# Patient Record
Sex: Female | Born: 1980 | Race: Black or African American | Hispanic: No | Marital: Single | Smoking: Never smoker
Health system: Southern US, Community
[De-identification: ages and names within clinical notes are randomized; demographics above are authoritative.]

## PROBLEM LIST (undated history)

## (undated) DIAGNOSIS — Z8742 Personal history of other diseases of the female genital tract: Secondary | ICD-10-CM

## (undated) DIAGNOSIS — D219 Benign neoplasm of connective and other soft tissue, unspecified: Secondary | ICD-10-CM

## (undated) DIAGNOSIS — F419 Anxiety disorder, unspecified: Secondary | ICD-10-CM

## (undated) HISTORY — DX: Benign neoplasm of connective and other soft tissue, unspecified: D21.9

## (undated) HISTORY — DX: Anxiety disorder, unspecified: F41.9

## (undated) HISTORY — DX: Personal history of other diseases of the female genital tract: Z87.42

---

## 2006-03-13 ENCOUNTER — Ambulatory Visit: Payer: Self-pay | Admitting: Internal Medicine

## 2006-03-17 ENCOUNTER — Ambulatory Visit: Payer: Self-pay | Admitting: Internal Medicine

## 2006-05-09 ENCOUNTER — Ambulatory Visit: Payer: Self-pay | Admitting: Internal Medicine

## 2007-01-23 ENCOUNTER — Inpatient Hospital Stay (HOSPITAL_COMMUNITY): Admission: EM | Admit: 2007-01-23 | Discharge: 2007-01-24 | Payer: Self-pay | Admitting: Family Medicine

## 2007-01-23 ENCOUNTER — Ambulatory Visit: Payer: Self-pay | Admitting: Family Medicine

## 2007-01-25 ENCOUNTER — Emergency Department (HOSPITAL_COMMUNITY): Admission: EM | Admit: 2007-01-25 | Discharge: 2007-01-25 | Payer: Self-pay | Admitting: Emergency Medicine

## 2007-01-26 ENCOUNTER — Emergency Department (HOSPITAL_COMMUNITY): Admission: EM | Admit: 2007-01-26 | Discharge: 2007-01-26 | Payer: Self-pay | Admitting: Emergency Medicine

## 2007-01-29 ENCOUNTER — Ambulatory Visit: Payer: Self-pay | Admitting: Family Medicine

## 2007-02-05 ENCOUNTER — Ambulatory Visit: Payer: Self-pay | Admitting: Family Medicine

## 2007-06-12 ENCOUNTER — Ambulatory Visit: Payer: Self-pay | Admitting: Gastroenterology

## 2007-07-26 ENCOUNTER — Ambulatory Visit: Payer: Self-pay | Admitting: Gastroenterology

## 2007-09-21 ENCOUNTER — Ambulatory Visit: Payer: Self-pay | Admitting: Internal Medicine

## 2007-10-04 ENCOUNTER — Ambulatory Visit: Payer: Self-pay | Admitting: Internal Medicine

## 2007-11-09 ENCOUNTER — Inpatient Hospital Stay (HOSPITAL_COMMUNITY): Admission: RE | Admit: 2007-11-09 | Discharge: 2007-11-11 | Payer: Self-pay | Admitting: *Deleted

## 2007-11-09 ENCOUNTER — Ambulatory Visit: Payer: Self-pay | Admitting: Gastroenterology

## 2007-11-09 ENCOUNTER — Emergency Department (HOSPITAL_COMMUNITY): Admission: EM | Admit: 2007-11-09 | Discharge: 2007-11-09 | Payer: Self-pay | Admitting: Emergency Medicine

## 2007-11-09 ENCOUNTER — Ambulatory Visit: Payer: Self-pay | Admitting: *Deleted

## 2008-01-03 ENCOUNTER — Ambulatory Visit: Payer: Self-pay | Admitting: Gastroenterology

## 2008-02-12 ENCOUNTER — Ambulatory Visit (HOSPITAL_COMMUNITY): Admission: RE | Admit: 2008-02-12 | Discharge: 2008-02-12 | Payer: Self-pay | Admitting: Obstetrics and Gynecology

## 2008-03-28 ENCOUNTER — Ambulatory Visit: Payer: Self-pay | Admitting: Gastroenterology

## 2008-06-17 ENCOUNTER — Ambulatory Visit: Payer: Self-pay | Admitting: Gastroenterology

## 2008-08-11 ENCOUNTER — Emergency Department (HOSPITAL_COMMUNITY): Admission: EM | Admit: 2008-08-11 | Discharge: 2008-08-11 | Payer: Self-pay | Admitting: Emergency Medicine

## 2008-09-15 ENCOUNTER — Ambulatory Visit: Payer: Self-pay | Admitting: Gastroenterology

## 2008-12-17 ENCOUNTER — Ambulatory Visit: Payer: Self-pay | Admitting: Gastroenterology

## 2009-05-31 ENCOUNTER — Ambulatory Visit (HOSPITAL_COMMUNITY): Admission: RE | Admit: 2009-05-31 | Discharge: 2009-05-31 | Payer: Self-pay | Admitting: Family Medicine

## 2010-11-01 LAB — URINALYSIS, ROUTINE W REFLEX MICROSCOPIC
Hgb urine dipstick: NEGATIVE
Protein, ur: 30 mg/dL — AB
Urobilinogen, UA: 1 mg/dL (ref 0.0–1.0)

## 2010-11-01 LAB — CBC
HCT: 42.4 % (ref 36.0–46.0)
Hemoglobin: 14 g/dL (ref 12.0–15.0)
RBC: 4.7 MIL/uL (ref 3.87–5.11)
RDW: 13.1 % (ref 11.5–15.5)
WBC: 6.2 10*3/uL (ref 4.0–10.5)

## 2010-11-01 LAB — DIFFERENTIAL
Eosinophils Relative: 0 % (ref 0–5)
Lymphocytes Relative: 16 % (ref 12–46)
Lymphs Abs: 1 10*3/uL (ref 0.7–4.0)
Monocytes Absolute: 0.5 10*3/uL (ref 0.1–1.0)
Monocytes Relative: 8 % (ref 3–12)
Neutro Abs: 4.7 10*3/uL (ref 1.7–7.7)

## 2010-11-01 LAB — BASIC METABOLIC PANEL
GFR calc Af Amer: >60 mL/min (ref >60)
GFR calc non Af Amer: >60 mL/min (ref >60)
Glucose, Bld: 114 mg/dL — ABNORMAL HIGH (ref 70–99)
Potassium: 3.5 mEq/L (ref 3.5–5.1)
Sodium: 138 mEq/L (ref 135–145)

## 2010-11-01 LAB — URINE MICROSCOPIC-ADD ON

## 2010-11-30 NOTE — Discharge Summary (Signed)
NAMECARRINGTON, MULLENAX              ACCOUNT NO.:  [id number]   MEDICAL RECORD NO.:  [id number]          PATIENT TYPE:  EMS   LOCATION:  MAJO                         FACILITY:  [facility]   PHYSICIAN:  Wayne A. Sheffield Slider, M.D.    DATE OF BIRTH:  03/27/1981   DATE OF ADMISSION:  01/23/2007  DATE OF DISCHARGE:  01/24/2007                               DISCHARGE SUMMARY   PRIMARY CARE PHYSICIAN:  See at [facility]   CONSULTATIONS:  None.   PROCEDURE:  None.   DISCHARGE DIAGNOSIS:  Migraine headache.   DISCHARGE MEDICATIONS:  Percocet 5 mg one tab every 4 hours as needed  for headache, #10 no refills.   BRIEF HOSPITAL COURSE:  Ms. Husby is a 30 year old female who presented  to the [facility] with headache, photophobia, neck  stiffness, and soreness, thrombocytopenia, and heme positive stools.  She was sent to the [facility] for possible  ehrlichiosis or viral meningitis.  Upon arrival she was afebrile and had  no meningeal signs.  She had a normal white count and platelets of 104.  She does have a history of migraines as a teenager.  Her at [facility], her only complaint was a headache.  She was admitted to  observe and LP was not done initially as patient was afebrile, had no  meningeal signs, or any objective or clinical signs of infectious  etiology.  She remained afebrile overnight.  A CBC and CMP were normal  in the morning.  Her only complaint remained a headache in the morning  by temporal throbbing with mild light sensitivity.  She was given 2  Percocet 5 mg tabs and approximately 4 hours later, the headache did  resolve and the patient felt much better.  As the patient had no  objective or clinical signs pointing to infectious etiology.  She was  sent home with the Percocet prescription as described above and given  instructions that should she develop a fever, weakness, confusion,  nausea, or vomiting she should be seen  immediately back at the [facility].  The patient was discharged home with her parents in  stable medical condition.      Ardeen Garland, MD  Electronically Signed      Arnette Norris. Sheffield Slider, M.D.  Electronically Signed    LM/MEDQ  D:  01/25/2007  T:  01/25/2007  Job:  [id number]

## 2010-11-30 NOTE — H&P (Signed)
[name], BHARDWAJ NO.:  [id number]   MEDICAL RECORD NO.:  [id number]          PATIENT TYPE:  IPS   LOCATION:  0304                          FACILITY[facility]  [facility]   PHYSICIAN:  Jasmine Pang, M.D. DATE OF BIRTH:  21-May-1981   DATE OF ADMISSION:  11/09/2007  DATE OF DISCHARGE:                       PSYCHIATRIC ADMISSION ASSESSMENT   This is a voluntary admission to the services of Dr. Milford Cage.   IDENTIFYING INFORMATION:  This is a 30 year old single [place] American  female.  Yesterday she drank a fifth of vodka and took 20 Extra Strength  Tylenol after having an argument with her boyfriend.  She then called a  friend of hers who happens to be a Veterinary surgeon.  She reported that she was  feeling depressed.  She stated she does not want to be here anymore,  that nothing is going right.  She had not been eating well the past 2  days and has had difficulty sleeping the last couple of days.  She  denied that her overdose was an attempt to harm herself but did want to  be assessed.  She states today that she is employed at [employer] in a  temporary position.  This was supposed to become a full-time job but due  to the economic situation, it will not.  She is hoping to relocate to  [place] but is very frustrated with the current economic situation.  She  does have an MBA.   PAST PSYCHIATRIC HISTORY:  In high school she felt sad.  She did not fit  in with anyone, she just wanted to quit school.  Her mom took her to  therapy.  She thinks she was on Prozac for a while but it did not seem  to help and she did not take it any length of time.  She denies any  other psychiatric history.   SOCIAL HISTORY:  She received her MBA in December 2007.  She does  currently have a boyfriend but she states they could use some time  apart.   FAMILY HISTORY:  She has an aunt who is treated for anxiety.   ALCOHOL AND DRUG HISTORY:  She does not have any.   PRIMARY CARE PHYSICIAN:  Dr.  Merla Riches.  She does not have any current  psychiatric care.   MEDICAL PROBLEMS:  She does not have any.   MEDICATIONS:  She takes birth control pills.   ALLERGIES:  She has no known drug allergies.   POSITIVE PHYSICAL FINDINGS:  She was medically cleared in the emergency  department.  She did not even need to have charcoal as she had already  vomited up the pills.  Her labs did not have any abnormal findings.  Vital signs on admission show that she is 61-and-a-quarter-inches tall.  She weighs 165.  Temperature is 99.5, blood pressure is 133/82 to  121/76, pulse was 72 and respirations are 18.   MENTAL STATUS EXAM:  Today she is alert and oriented x3.  She was  appropriately groomed and dressed in hospital scrubs.  She appeared to  be adequately nourished.  Her speech  was not pressured.  Her mood is  appropriate to the situation.  Her affect had a normal range.  Her  thought processes are clear, rational and goal oriented.  Judgment and  insight are good, intact.  Concentration and memory are intact.  Intelligence is at least average.  She denies being suicidal or  homicidal.  She denies auditory or visual hallucinations.  She denies  the need for an antidepressant.  She states she just made a bad decision  yesterday.   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  AXIS II:  Deferred.  AXIS III:  Mononucleosis x1 in the past.  AXIS IV:  Severe issues with primary support group, occupational issues.  AXIS V:  30.   The plan is to admit for safety and stabilization.  Her mom has come in  and Dr. Katrinka Blazing indicated that she would like the counselors to have a  family session with her mom and they will make a decision whether to  start an antidepressant or not.  Estimated length of stay is going to be  2-3 days.      Mickie Leonarda Salon, P.A.-C.      Jasmine Pang, M.D.  Electronically Signed     MD/MEDQ  D:  11/10/2007  T:  11/10/2007  Job:  [id number]
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
## 2010-12-03 NOTE — Assessment & Plan Note (Signed)
[facility]                           GASTROENTEROLOGY OFFICE NOTE   NAME:Neisen, SYBOL MORRE                     MRN:          [id number]  DATE:03/13/2006                            DOB:          [birth year]    REFERRAL SOURCE:  The patient is self-referred.   REASON FOR CONSULTATION:  Worsening constipation, abdominal pain and rectal  bleeding.   HISTORY:  This is a pleasant 30 year old [place] American female with no  significant past medical history, who refers herself for evaluation  regarding constipation, abdominal pain and rectal bleeding.  She reports  problems with severe constipation dating approximately 7 years.  She  initially thought the problem was due to her lifestyle and after college,  began on a regimen of exercise and healthy diet.  With this, she did notice  some improvement in her bowel habits.  However, in recent years, despite  these measures, the constipation has worsened.  Associated with constipation  is significant abdominal pain and occasional vomiting.  In order to have  regular bowel movements, she takes a senna-based herbal laxative 3 times per  week.  Post defecation, her abdominal symptoms improve.  Over the past 2  weeks, she has noticed a new symptom of rectal bleeding manifested  principally by drops of red blood in the toilet as well as on the tissue.  No associated rectal pain.  She has not had prior evaluation.  She describes  her appetite as good and despite exercise, her weight has increased.  This  disturbs her.   PAST MEDICAL HISTORY:  None.   PAST SURGICAL HISTORY:  None.   ALLERGIES:  No known drug allergies.   REGULAR MEDICATIONS:  A senna-based herbal laxative three times per week.   FAMILY HISTORY:  No family history of colon cancer or colon polyps.  The  patient thinks that both of her parents have had colonoscopy with no  neoplasia found.   SOCIAL HISTORY:  The patient is single without  children.  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  She does not smoke or use alcohol.   REVIEW OF SYSTEMS:  Per diagnostic evaluation performed.   PHYSICAL EXAMINATION:  GENERAL:  A pleasant, well-appearing female in no  acute distress.  VITAL SIGNS:  Blood pressure is 120/80.  Heart rate is 60 and regular.  Weight is 175.2 pounds.  She is 5 feet 2 inches in height.  HEENT:  Sclerae are anicteric.  Conjunctivae are pink.  Oral mucosa intact.  NECK:  No adenopathy.  Thyroid appears normal.  LUNGS:  Clear.  HEART:  Regular.  ABDOMEN:  Obese and soft without tenderness, mass or hernia.  Good bowel  sounds are heard.  RECTAL:  Exam was deferred.  EXTREMITIES:  Without edema.   IMPRESSION:  This is a 30 year old female with chronic worsening  constipation associated with abdominal pain, occasional vomiting and now new-  onset rectal bleeding.  She most likely has functional constipation with  bleeding due to minor benign anorectal pathology.  Organic  causes for  constipation and obstructive pathology should be excluded.   RECOMMENDATIONS:  1. Laboratories today including thyroid-stimulating hormone, CBC, and      comprehensive metabolic panel to include calcium.  2. Schedule colonoscopy.  The nature of the procedure as well as the      risks, benefits and alternatives were reviewed.  She understood and      agreed to proceed.  3. If laboratories and colonoscopy unrevealing for significant pathology,      then provide daily bowel regimen in the form of MiraLax.                                   Wilhemina Bonito. Eda Keys., MD   JNP/MedQ  DD:  03/13/2006  DT:  03/14/2006  Job #:  [id number]
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
## 2010-12-03 NOTE — Assessment & Plan Note (Signed)
 [facility]                           GASTROENTEROLOGY OFFICE NOTE   NAME:Meunier, [name]                     MRN:          [id number]  DATE:05/09/2006                            DOB:          1980-08-01    HISTORY:  [name] presents today for followup.  She is a 30 year old  evaluated March 13, 2006 for worsening constipation with associated  abdominal pain and rectal bleeding.  On March 17, 2006 she underwent  complete colonoscopy including intubation of the terminal ileum.  This was  normal.  She was placed on MiraLax for her bowels.  On MiraLax she has  regular bowel movements without pain.  No further bleeding.  She does  describe 1 episode last weeks where she developed some midabdominal  discomfort, diaphoresis and presyncopal symptoms with vomiting.  Initially  clear, subsequently dark mucoid material.  She also had some transient dark  stools which have resolved.  After her episode of pain no further recurrence  or other symptoms.  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   PHYSICAL EXAMINATION:  Finds a well-appearing female in no acute distress.  Blood pressure 110/68.  Heart rate 60.  Weight is 171.4 pounds.  HEENT:  Sclerae anicteric.  Conjunctivae are pink.  LUNGS:  Are clear.  HEART:  Regular.  ABDOMEN:  Soft without tenderness, mass or hernia.   IMPRESSION:  1. Functional constipation.  Negative colonoscopy.  Improved on Miralax.  2. Minor rectal bleeding likely due to benign intrarectal pathology.  3. Recent episode of transient abdominal discomfort with transient change      in stool color.  Improved.  Cause uncertain.   RECOMMENDATIONS:  1. Continue Miralax as needed for constipation.  2. Follow up as needed.  If she were to develop recurrent problems with      abdominal pain, vomiting or dark stools, she should return for      additional evaluation.  She understands.      ______________________________  Wilhemina Bonito. Eda Keys., MD      JNP/MedQ  DD:  05/09/2006  DT:  05/10/2006  Job #:  [id number]

## 2011-04-12 LAB — COMPREHENSIVE METABOLIC PANEL
ALT: 16
AST: 23
CO2: 24
Calcium: 9.5
Chloride: 108
Creatinine, Ser: 0.9
GFR calc non Af Amer: >60
Glucose, Bld: 88
Total Bilirubin: 1.8 — ABNORMAL HIGH

## 2011-04-12 LAB — DIFFERENTIAL
Basophils Absolute: 0
Eosinophils Absolute: 0
Eosinophils Relative: 1
Lymphocytes Relative: 35
Lymphs Abs: 1.6
Neutrophils Relative %: 53

## 2011-04-12 LAB — POCT PREGNANCY, URINE: Preg Test, Ur: NEGATIVE

## 2011-04-12 LAB — CBC
Hemoglobin: 13.6
MCHC: 34
MCV: 88.2
RBC: 4.53
WBC: 4.6

## 2011-04-12 LAB — RAPID URINE DRUG SCREEN, HOSP PERFORMED
Barbiturates: NOT DETECTED
Benzodiazepines: NOT DETECTED

## 2011-04-12 LAB — URINALYSIS, ROUTINE W REFLEX MICROSCOPIC
Leukocytes, UA: NEGATIVE
Nitrite: NEGATIVE
Protein, ur: 30 — AB
Specific Gravity, Urine: 1.043 — ABNORMAL HIGH
Urobilinogen, UA: 1

## 2011-04-12 LAB — URINE MICROSCOPIC-ADD ON

## 2011-04-12 LAB — TRICYCLICS SCREEN, URINE: TCA Scrn: NOT DETECTED

## 2011-04-12 LAB — SALICYLATE LEVEL: Salicylate Lvl: <4

## 2011-05-03 LAB — DIFFERENTIAL
Basophils Absolute: 0
Basophils Relative: 0
Basophils Relative: 0
Eosinophils Absolute: 0
Eosinophils Relative: 0
Lymphs Abs: 1.6
Metamyelocytes Relative: 0
Monocytes Absolute: 0.7
Monocytes Relative: 8
Myelocytes: 0
Neutro Abs: 2.9
Neutrophils Relative %: 63
Neutrophils Relative %: 71

## 2011-05-03 LAB — I-STAT 8, (EC8 V) (CONVERTED LAB)
Acid-base deficit: 1
BUN: 3 — ABNORMAL LOW
Bicarbonate: 23.6
HCT: 39
Operator id: 192351
TCO2: 25
pCO2, Ven: 37.9 — ABNORMAL LOW

## 2011-05-03 LAB — CBC
HCT: 36.4
HCT: 39.5
HCT: 42.2
Hemoglobin: 13.2
Hemoglobin: 13.9
MCHC: 33.1
MCHC: 33.5
MCHC: 33.5
MCV: 85
MCV: 86.1
Platelets: 200
Platelets: 201
Platelets: 216
Platelets: 227
RBC: 4.28
RDW: 13.7
RDW: 13.8
RDW: 13.8
WBC: 5.6

## 2011-05-03 LAB — BASIC METABOLIC PANEL
BUN: 6
BUN: 6
CO2: 26
Chloride: 103
Glucose, Bld: 99
Potassium: 3.7
Potassium: 3.7
Sodium: 139

## 2011-05-03 LAB — POCT I-STAT CREATININE
Creatinine, Ser: 0.9
Operator id: 192351

## 2011-05-03 LAB — COMPREHENSIVE METABOLIC PANEL
Alkaline Phosphatase: 59
BUN: 6
Calcium: 9.6
Creatinine, Ser: 0.81
Glucose, Bld: 93
Potassium: 4
Total Protein: 7.7

## 2011-05-03 LAB — URINALYSIS, ROUTINE W REFLEX MICROSCOPIC
Glucose, UA: NEGATIVE
Ketones, ur: 40 — AB
pH: 6

## 2011-06-08 ENCOUNTER — Ambulatory Visit
Admission: RE | Admit: 2011-06-08 | Discharge: 2011-06-08 | Disposition: A | Discharge status: Home / Self Care | Payer: BC Managed Care – PPO | Source: Ambulatory Visit | Attending: Family Medicine | Admitting: Family Medicine

## 2011-06-08 ENCOUNTER — Other Ambulatory Visit: Payer: Self-pay | Admitting: Family Medicine

## 2011-06-08 DIAGNOSIS — R05 Cough: Secondary | ICD-10-CM

## 2011-09-05 ENCOUNTER — Ambulatory Visit: Payer: Self-pay

## 2011-09-12 ENCOUNTER — Ambulatory Visit: Payer: Self-pay

## 2013-01-05 ENCOUNTER — Emergency Department (HOSPITAL_COMMUNITY): Payer: BC Managed Care – PPO

## 2013-01-05 ENCOUNTER — Emergency Department (HOSPITAL_COMMUNITY)
Admission: EM | Admit: 2013-01-05 | Discharge: 2013-01-05 | Disposition: A | Discharge status: Home / Self Care | Payer: BC Managed Care – PPO | Attending: Emergency Medicine | Admitting: Emergency Medicine

## 2013-01-05 ENCOUNTER — Encounter (HOSPITAL_COMMUNITY): Payer: Self-pay

## 2013-01-05 DIAGNOSIS — Y9389 Activity, other specified: Secondary | ICD-10-CM | POA: Insufficient documentation

## 2013-01-05 DIAGNOSIS — S0990XA Unspecified injury of head, initial encounter: Secondary | ICD-10-CM | POA: Insufficient documentation

## 2013-01-05 DIAGNOSIS — S0993XA Unspecified injury of face, initial encounter: Secondary | ICD-10-CM | POA: Insufficient documentation

## 2013-01-05 DIAGNOSIS — R519 Headache, unspecified: Secondary | ICD-10-CM

## 2013-01-05 DIAGNOSIS — Y92410 Unspecified street and highway as the place of occurrence of the external cause: Secondary | ICD-10-CM | POA: Insufficient documentation

## 2013-01-05 MED ORDER — HYDROCODONE-ACETAMINOPHEN 5-325 MG PO TABS
1.0000 | ORAL_TABLET | Freq: Once | ORAL | Status: DC
  Filled 2013-01-05: qty 1

## 2013-01-05 MED ORDER — ONDANSETRON 4 MG PO TBDP
8.0000 mg | ORAL_TABLET | Freq: Once | ORAL | Status: AC
  Administered 2013-01-05: 8 mg via ORAL
  Filled 2013-01-05: qty 2

## 2013-01-05 MED ORDER — HYDROCODONE-ACETAMINOPHEN 5-325 MG PO TABS: 1.0000 | ORAL_TABLET | Freq: Four times a day (QID) | ORAL | Status: DC | PRN

## 2013-01-05 MED ORDER — KETOROLAC TROMETHAMINE 60 MG/2ML IM SOLN
60.0000 mg | Freq: Once | INTRAMUSCULAR | Status: AC
  Administered 2013-01-05: 60 mg via INTRAMUSCULAR
  Filled 2013-01-05: qty 2

## 2013-01-05 MED ORDER — IBUPROFEN 800 MG PO TABS: 800.0000 mg | ORAL_TABLET | Freq: Three times a day (TID) | ORAL | Status: AC | PRN

## 2013-01-05 NOTE — ED Notes (Signed)
Pt. Very nauseated, reported to [place] , [place] will given hydrocodone once pt.s nausea has been relieved.
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
## 2013-01-05 NOTE — ED Notes (Signed)
Given patient [place] sandwich, apple sauce, and soda.
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
## 2013-01-06 NOTE — ED Provider Notes (Signed)
Medical screening examination/treatment/procedure(s) were performed by non-physician practitioner and as supervising physician I was immediately available for consultation/collaboration.  Ethelda Chick, MD 01/06/13 2051

## 2018-04-26 ENCOUNTER — Encounter: Payer: Self-pay | Admitting: Obstetrics and Gynecology

## 2018-07-06 ENCOUNTER — Other Ambulatory Visit: Payer: Self-pay

## 2018-07-06 ENCOUNTER — Other Ambulatory Visit (HOSPITAL_COMMUNITY)
Admission: RE | Admit: 2018-07-06 | Discharge: 2018-07-06 | Disposition: A | Discharge status: Home / Self Care | Payer: BC Managed Care – PPO | Source: Ambulatory Visit | Attending: Obstetrics and Gynecology | Admitting: Obstetrics and Gynecology

## 2018-07-06 ENCOUNTER — Ambulatory Visit: Payer: BC Managed Care – PPO | Admitting: Obstetrics and Gynecology

## 2018-07-06 ENCOUNTER — Encounter: Payer: Self-pay | Admitting: Obstetrics and Gynecology

## 2018-07-06 VITALS — BP 112/72 | HR 80 | Resp 16 | Ht 62.25 in | Wt 214.0 lb

## 2018-07-06 DIAGNOSIS — N760 Acute vaginitis: Secondary | ICD-10-CM | POA: Insufficient documentation

## 2018-07-06 DIAGNOSIS — Z23 Encounter for immunization: Secondary | ICD-10-CM | POA: Diagnosis not present

## 2018-07-06 DIAGNOSIS — R87610 Atypical squamous cells of undetermined significance on cytologic smear of cervix (ASC-US): Secondary | ICD-10-CM | POA: Insufficient documentation

## 2018-07-06 DIAGNOSIS — N888 Other specified noninflammatory disorders of cervix uteri: Secondary | ICD-10-CM

## 2018-07-06 DIAGNOSIS — Z113 Encounter for screening for infections with a predominantly sexual mode of transmission: Secondary | ICD-10-CM | POA: Diagnosis present
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
## 2018-07-06 NOTE — Patient Instructions (Signed)
HPV Vaccine Information for Parents    HPV (human papillomavirus) is a common virus that spreads from person to person through sexual contact. It can spread during vaginal, anal, or oral sex. There are many types of HPV viruses, and some may cause cancer.  Your child can get a vaccination to prevent HPV infection and cancer. The vaccine is both safe and effective. It is recommended for boys and girls at about 11-37 years of age. Getting the vaccination at this age--before becoming sexually active--gives your child the best chance at protection from HPV infection through adulthood.  How can HPV affect my child?  HPV infection can cause:  · Genital warts.  · Mouth or throat cancer (oropharyngeal cancer).  · Anal cancer.  · Cervical, vulvar, or vaginal cancer.  · Penile cancer.  During pregnancy, HPV infection can be passed to the baby. This infection can cause warts to develop in a baby's throat and windpipe.  What actions can I take to lower my child's risk for HPV?  To lower your child's risk for HPV infection, have him or her get the HPV vaccination before becoming sexually active. The best time for vaccination is between ages 11 and 12, though it can be given to children as young as 9 years old. If your child gets the first dose before age 15, the vaccination can be given as 2 shots (doses), 6-12 months apart. In some situations, 3 doses are needed:  · If your child starts the vaccine before age 15 but does not have a second dose within 6-12 months, your child will need 3 doses to complete the vaccination. When your child has the first dose, it is important to make an appointment for the next shot and keep the appointment.  · Teens who are not vaccinated before age 15 will need 3 doses given within 6 months.  · If your child has a weak immune system, he or she may need 3 doses.  Young adults can also get the vaccination, even if they are already sexually active and even if they have already been infected with HPV.  The vaccination can still help prevent the types of cancer-causing HPV that a person has not been infected with.  What are the risks and benefits of the HPV vaccine?  Benefits  The main benefit of getting vaccinated is to prevent certain cancers, including:  · Cervical, vulvar, and vaginal cancer in females.  · Penile cancer in males.  · Oral and anal cancer in both males and females.  The risk of these cancers is lower if your child gets vaccinated before he or she becomes sexually active.  The vaccine also prevents genital warts caused by HPV.  Risks  The risks, although low, include side effects or reactions to the vaccine. Very few reactions have been reported, but they can include:  · Soreness, redness, or swelling at the injection site.  · Dizziness or headache.  · Fever.  Who should not get the HPV vaccine or should wait to get it?  Some children should not get the HPV vaccine or should wait. Discuss the risks and benefits of the vaccine with your child's health care provider if your child:  · Has had a severe allergic reaction to other vaccinations.  · Is allergic to yeast.  · Has a fever.  · Has had a recent illness.  · Is pregnant or may be pregnant.  Where to find more information  · Centers for Disease Control and   get a vaccination to prevent HPV infection and cancer. It is best to get the vaccination before becoming sexually active.  The HPV vaccine can protect your child from genital warts and certain types of cancer, including cancer of the cervix, throat, mouth, vulva, vagina, anus, and penis.  The HPV vaccine is both safe and effective.  The best time for boys and girls to get the vaccination is when they are between ages 26 and  52. This information is not intended to replace advice given to you by your health care provider. Make sure you discuss any questions you have with your health care provider. Document Released: 09/21/2017 Document Revised: 11/20/2017 Document Reviewed: 09/21/2017 Elsevier Interactive Patient Education  2019 [employer]. Pap Test Why am I having this test? A Pap test, also called a Pap smear, is a screening test to check for signs of:  Cancer of the vagina, cervix, and uterus. The cervix is the lower part of the uterus that opens into the vagina.  Infection.  Changes that may be a sign that cancer is developing (precancerous changes). Women need this test on a regular basis. In general, you should have a Pap test every 3 years until you reach menopause or age 17. Women aged 30-60 may choose to have their Pap test done at the same time as an HPV (human papillomavirus) test every 5 years (instead of every 3 years). Your health care provider may recommend having Pap tests more or less often depending on your medical conditions and past Pap test results. What kind of sample is taken?  Your health care provider will collect a sample of cells from the surface of your cervix. This will be done using a small cotton swab, plastic spatula, or brush. This sample is often collected during a pelvic exam, when you are lying on your back on an exam table with feet in footrests (stirrups). In some cases, fluids (secretions) from the cervix or vagina may also be collected. How do I prepare for this test?  Be aware of where you are in your menstrual cycle. If you are menstruating on the day of the test, you may be asked to reschedule.  You may need to reschedule if you have a known vaginal infection on the day of the test.  Follow instructions from your health care provider about: ? Changing or stopping your regular medicines. Some medicines can cause abnormal test results, such as digitalis and  tetracycline. ? Avoiding douching or taking a bath the day before or the day of the test. Tell a health care provider about:  Any allergies you have.  All medicines you are taking, including vitamins, herbs, eye drops, creams, and over-the-counter medicines.  Any blood disorders you have.  Any surgeries you have had.  Any medical conditions you have.  Whether you are pregnant or may be pregnant. How are the results reported? Your test results will be reported as either abnormal or normal. A false-positive result can occur. A false positive is incorrect because it means that a condition is present when it is not. A false-negative result can occur. A false negative is incorrect because it means that a condition is not present when it is. What do the results mean? A normal test result means that you do not have signs of cancer of the vagina, cervix, or uterus. An abnormal result may mean that you have:  Cancer. A Pap test by itself is not enough to diagnose cancer. You will have more  tests done in this case.  Precancerous changes in your vagina, cervix, or uterus.  Inflammation of the cervix.  An STD (sexually transmitted disease).  A fungal infection.  A parasite infection. Talk with your health care provider about what your results mean. Questions to ask your health care provider Ask your health care provider, or the department that is doing the test:  When will my results be ready?  How will I get my results?  What are my treatment options?  What other tests do I need?  What are my next steps? Summary  In general, women should have a Pap test every 3 years until they reach menopause or age 18.  Your health care provider will collect a sample of cells from the surface of your cervix. This will be done using a small cotton swab, plastic spatula, or brush.  In some cases, fluids (secretions) from the cervix or vagina may also be collected. This information is not  intended to replace advice given to you by your health care provider. Make sure you discuss any questions you have with your health care provider. Document Released: 09/24/2002 Document Revised: 03/13/2017 Document Reviewed: 03/13/2017 Elsevier Interactive Patient Education  2019 [employer]. Vaginitis Vaginitis is a condition in which the vaginal tissue swells and becomes red (inflamed). This condition is most often caused by a change in the normal balance of bacteria and yeast that live in the vagina. This change causes an overgrowth of certain bacteria or yeast, which causes the inflammation. There are different types of vaginitis, but the most common types are:  Bacterial vaginosis.  Yeast infection (candidiasis).  Trichomoniasis vaginitis. This is a sexually transmitted disease (STD).  Viral vaginitis.  Atrophic vaginitis.  Allergic vaginitis. What are the causes? The cause of this condition depends on the type of vaginitis. It can be caused by:  Bacteria (bacterial vaginosis).  Yeast, which is a fungus (yeast infection).  A parasite (trichomoniasis vaginitis).  A virus (viral vaginitis).  Low hormone levels (atrophic vaginitis). Low hormone levels can occur during pregnancy, breastfeeding, or after menopause.  Irritants, such as bubble baths, scented tampons, and feminine sprays (allergic vaginitis). Other factors can change the normal balance of the yeast and bacteria that live in the vagina. These include:  Antibiotic medicines.  Poor hygiene.  Diaphragms, vaginal sponges, spermicides, birth control pills, and intrauterine devices (IUD).  Sex.  Infection.  Uncontrolled diabetes.  A weakened defense (immune) system. What increases the risk? This condition is more likely to develop in women who:  Smoke.  Use vaginal douches, scented tampons, or scented sanitary pads.  Wear tight-fitting pants.  Wear thong underwear.  Use oral birth control pills or an  IUD.  Have sex without a condom.  Have multiple sex partners.  Have an STD.  Frequently use the spermicide nonoxynol-9.  Eat lots of foods high in sugar.  Have uncontrolled diabetes.  Have low estrogen levels.  Have a weakened immune system from an immune disorder or medical treatment.  Are pregnant or breastfeeding. What are the signs or symptoms? Symptoms vary depending on the cause of the vaginitis. Common symptoms include:  Abnormal vaginal discharge. ? The discharge is white, gray, or yellow with bacterial vaginosis. ? The discharge is thick, white, and cheesy with a yeast infection. ? The discharge is frothy and yellow or greenish with trichomoniasis.  A bad vaginal smell. The smell is fishy with bacterial vaginosis.  Vaginal itching, pain, or swelling.  Sex that is painful.  Pain or burning when urinating. Sometimes there are no symptoms. How is this diagnosed? This condition is diagnosed based on your symptoms and medical history. A physical exam, including a pelvic exam, will also be done. You may also have other tests, including:  Tests to determine the pH level (acidity or alkalinity) of your vagina.  A whiff test, to assess the odor that results when a sample of your vaginal discharge is mixed with a potassium hydroxide solution.  Tests of vaginal fluid. A sample will be examined under a microscope. How is this treated? Treatment varies depending on the type of vaginitis you have. Your treatment may include:  Antibiotic creams or pills to treat bacterial vaginosis and trichomoniasis.  Antifungal medicines, such as vaginal creams or suppositories, to treat a yeast infection.  Medicine to ease discomfort if you have viral vaginitis. Your sexual partner should also be treated.  Estrogen delivered in a cream, pill, suppository, or vaginal ring to treat atrophic vaginitis. If vaginal dryness occurs, lubricants and moisturizing creams may help. You may need  to avoid scented soaps, sprays, or douches.  Stopping use of a product that is causing allergic vaginitis. Then using a vaginal cream to treat the symptoms. Follow these instructions at home: Lifestyle  Keep your genital area clean and dry. Avoid soap, and only rinse the area with water.  Do not douche or use tampons until your health care provider says it is okay to do so. Use sanitary pads, if needed.  Do not have sex until your health care provider approves. When you can return to sex, practice safe sex and use condoms.  Wipe from front to back. This avoids the spread of bacteria from the rectum to the vagina. General instructions  Take over-the-counter and prescription medicines only as told by your health care provider.  If you were prescribed an antibiotic medicine, take or use it as told by your health care provider. Do not stop taking or using the antibiotic even if you start to feel better.  Keep all follow-up visits as told by your health care provider. This is important. How is this prevented?  Use mild, non-scented products. Do not use things that can irritate the vagina, such as fabric softeners. Avoid the following products if they are scented: ? Feminine sprays. ? Detergents. ? Tampons. ? Feminine hygiene products. ? Soaps or bubble baths.  Let air reach your genital area. ? Wear cotton underwear to reduce moisture buildup. ? Avoid wearing underwear while you sleep. ? Avoid wearing tight pants and underwear or nylons without a cotton panel. ? Avoid wearing thong underwear.  Take off any wet clothing, such as bathing suits, as soon as possible.  Practice safe sex and use condoms. Contact a health care provider if:  You have abdominal pain.  You have a fever.  You have symptoms that last for more than 2-3 days. Get help right away if:  You have a fever and your symptoms suddenly get worse. Summary  Vaginitis is a condition in which the vaginal tissue  becomes inflamed.This condition is most often caused by a change in the normal balance of bacteria and yeast that live in the vagina.  Treatment varies depending on the type of vaginitis you have.  Do not douche, use tampons , or have sex until your health care provider approves. When you can return to sex, practice safe sex and use condoms. This information is not intended to replace advice given to you by your health  care provider. Make sure you discuss any questions you have with your health care provider. Document Released: 05/01/2007 Document Revised: 08/09/2016 Document Reviewed: 08/09/2016 Elsevier Interactive Patient Education  2019 [employer].

## 2018-07-07 LAB — HEPATITIS C ANTIBODY: Hep C Virus Ab: <0.1 s/co ratio (ref 0.0–0.9)

## 2018-07-09 ENCOUNTER — Other Ambulatory Visit (INDEPENDENT_AMBULATORY_CARE_PROVIDER_SITE_OTHER): Payer: BC Managed Care – PPO

## 2018-07-09 DIAGNOSIS — Z113 Encounter for screening for infections with a predominantly sexual mode of transmission: Secondary | ICD-10-CM

## 2018-07-09 DIAGNOSIS — R87610 Atypical squamous cells of undetermined significance on cytologic smear of cervix (ASC-US): Secondary | ICD-10-CM | POA: Insufficient documentation

## 2018-07-10 LAB — HEP, RPR, HIV PANEL
HIV SCREEN 4TH GENERATION: NONREACTIVE
Hepatitis B Surface Ag: NEGATIVE
RPR: NONREACTIVE

## 2018-07-12 LAB — CYTOLOGY - PAP
BACTERIAL VAGINITIS: NEGATIVE
CANDIDA VAGINITIS: POSITIVE — AB
CHLAMYDIA, DNA PROBE: NEGATIVE
Diagnosis: NEGATIVE
HPV (WINDOPATH): NOT DETECTED
NEISSERIA GONORRHEA: NEGATIVE
Trichomonas: NEGATIVE

## 2018-07-16 ENCOUNTER — Telehealth: Payer: Self-pay

## 2018-07-16 ENCOUNTER — Telehealth: Payer: Self-pay | Admitting: Obstetrics and Gynecology

## 2018-07-16 MED ORDER — FLUCONAZOLE 150 MG PO TABS: 150.0000 mg | ORAL_TABLET | Freq: Once | ORAL | 0 refills | Status: AC

## 2018-07-16 NOTE — Telephone Encounter (Signed)
Spoke with patient. Patient does not feel comfortable having PUS on menses. Appointment rescheduled to 07/26/2017 at 1:30 pm with 2 pm consult with Dr.Silva. Patient is agreeable to date and time.  Routing to provider and will close encounter.

## 2018-07-16 NOTE — Telephone Encounter (Signed)
Patient cancelled her ultrasound appointment for Thursday. Her period started and she would like a call to reschedule.

## 2018-07-16 NOTE — Telephone Encounter (Signed)
-----   Message from Nunzio Cobbs, MD sent at 07/15/2018 11:46 AM EST ----- Please report results of testing to patient.  Her pap is normal and her HR HPV status is negative.  Fortunately, she does not need a colposcopy.  She had been referred for 2 ASCUS paps done one month apart with her PCP.  No HPV testing had been done then.  I do recommend a next pap and HR HPV testing in one year.  She needs a recall 08.  Her PCP will need a copy of her pap done by me on 07/06/18.  Testing is positive for yeast and I recommend Diflucan 150 mg po x 1.  May repeat in 72 hours prn.  Dispense:  2 RF none.   Testing is negative for BV, chlamydia, gonorrhea, and trichomonas.

## 2018-07-16 NOTE — Telephone Encounter (Signed)
Spoke with patient. Advised of results and message as seen below from [place]. Patient verbalizes understanding. 08 recall entered. rx for Diflucan 150 mg po x 1 repeat in 72 hours if symptoms persist #2 0RF sent to pharmacy on file. Patient verbalizes understanding. Copy of 07/06/2018 pap sent to patient's PCP. Encounter closed.

## 2018-07-19 ENCOUNTER — Other Ambulatory Visit: Payer: BC Managed Care – PPO

## 2018-07-19 ENCOUNTER — Other Ambulatory Visit: Payer: BC Managed Care – PPO | Admitting: Obstetrics and Gynecology

## 2018-07-26 ENCOUNTER — Other Ambulatory Visit: Payer: Self-pay

## 2018-07-26 ENCOUNTER — Ambulatory Visit (INDEPENDENT_AMBULATORY_CARE_PROVIDER_SITE_OTHER): Payer: BC Managed Care – PPO

## 2018-07-26 ENCOUNTER — Other Ambulatory Visit: Payer: BC Managed Care – PPO

## 2018-07-26 ENCOUNTER — Ambulatory Visit: Payer: BC Managed Care – PPO | Admitting: Obstetrics and Gynecology

## 2018-07-26 ENCOUNTER — Encounter: Payer: Self-pay | Admitting: Obstetrics and Gynecology

## 2018-07-26 VITALS — BP 122/70 | HR 80 | Ht 62.25 in | Wt 212.0 lb

## 2018-07-26 DIAGNOSIS — D219 Benign neoplasm of connective and other soft tissue, unspecified: Secondary | ICD-10-CM

## 2018-07-26 DIAGNOSIS — N888 Other specified noninflammatory disorders of cervix uteri: Secondary | ICD-10-CM | POA: Diagnosis not present

## 2018-07-26 NOTE — Patient Instructions (Signed)

## 2018-07-26 NOTE — Progress Notes (Signed)
GYNECOLOGY  VISIT   HPI: 38 y.o.   Single  African American  female   [id number] with Patient's last menstrual period was 07/19/2018 (exact date).   here for pelvic ultrasound.    Had a 1.5 cm left posterior cervical firmness on pelvic US done 07/06/18.  Has urinary frequency.   GYNECOLOGIC HISTORY: Patient's last menstrual period was 07/19/2018 (exact date). Contraception: Abstinence Menopausal hormone therapy:  n/a Last mammogram:  n/a Last pap smear: 07-06-18 Neg:Neg HR HPV                              06-06-18 ASCUS        OB History    Gravida  3   Para  0   Term  0   Preterm  0   AB  3   Living  0     SAB  0   TAB  0   Ectopic  0   Multiple  0   Live Births  0              Patient Active Problem List   Diagnosis Date Noted  . Atypical squamous cells of undetermined significance (ASCUS) on Papanicolaou smear of cervix 07/09/2018    Past Medical History:  Diagnosis Date  . Anxiety   . Fibroid    by [place] 2020  . History of menstrual cramps     No past surgical history on file.  Current Outpatient Medications  Medication Sig Dispense Refill  . ibuprofen (ADVIL,MOTRIN) 800 MG tablet Take 1 tablet (800 mg total) by mouth every 8 (eight) hours as needed for pain. 21 tablet 0   No current facility-administered medications for this visit.      ALLERGIES: Patient has no known allergies.  Family History  Problem Relation Age of Onset  . Diabetes Father   . Non-Hodgkin's lymphoma Father   . Fibroids Maternal Grandmother     Social History   Socioeconomic History  . Marital status: Single    Spouse name: Not on file  . Number of children: Not on file  . Years of education: Not on file  . Highest education level: Not on file  Occupational History  . Not on file  Social Needs  . Financial resource strain: Not on file  . Food insecurity:    Worry: Not on file    Inability: Not on file  . Transportation needs:    Medical: Not on file   Non-medical: Not on file  Tobacco Use  . Smoking status: Never Smoker  . Smokeless tobacco: Never Used  Substance and Sexual Activity  . Alcohol use: No  . Drug use: Never  . Sexual activity: Not Currently    Birth control/protection: Condom  Lifestyle  . Physical activity:    Days per week: Not on file    Minutes per session: Not on file  . Stress: Not on file  Relationships  . Social connections:    Talks on phone: Not on file    Gets together: Not on file    Attends religious service: Not on file    Active member of club or organization: Not on file    Attends meetings of clubs or organizations: Not on file    Relationship status: Not on file  . Intimate partner violence:    Fear of current or ex partner: Not on file    Emotionally abused: Not on file  Physically abused: Not on file    Forced sexual activity: Not on file  Other Topics Concern  . Not on file  Social History Narrative  . Not on file    Review of Systems  All other systems reviewed and are negative.   PHYSICAL EXAMINATION:    BP 122/70 (BP Location: Right Arm, Patient Position: Sitting, Cuff Size: Large)   Pulse 80   Ht 5' 2.25" (1.581 m)   Wt 212 lb (96.2 kg)   LMP 07/19/2018 (Exact Date)   BMI 38.46 kg/m     General appearance: alert, cooperative and appears stated age  Pelvic US 2 fibroids including 2 cm posterior cervical fibroid. Larger fibroid is 3.64 mm.  EMS 4.89 mm.  Normal ovaries.  No free fluid.  ASSESSMENT  Fibroid uterus.  Gardasil vaccine series.  PLAN  Discussion of fibroids - benign nature, signs of symptoms, rare risk of sarcoma.  Next Gardasil around 09/06/18. She will do her next pap with her PCP and ask for HR HPV testing at that time.  FU prn.    An After Visit Summary was printed and given to the patient.  __15____ minutes face to face time of which over 50% was spent in counseling.

## 2018-09-06 ENCOUNTER — Ambulatory Visit: Payer: BC Managed Care – PPO

## 2018-09-10 NOTE — Progress Notes (Signed)
Patient in today for 2nd Gardasil injection.   Contraception: Abstinence  LMP: 09/01/18 Last OV: 07/25/18 with BS  Injection given in RD. Patient tolerated shot well.   Patient informed next injection due in about 4 months.  Advised patient, if not on birth control, to return for next injection with cycle.   Routed to provider for final review.  Encounter closed.

## 2018-09-11 ENCOUNTER — Ambulatory Visit (INDEPENDENT_AMBULATORY_CARE_PROVIDER_SITE_OTHER): Payer: BC Managed Care – PPO

## 2018-09-11 VITALS — BP 122/64 | HR 66 | Resp 16 | Ht 62.0 in | Wt 213.8 lb

## 2018-09-11 DIAGNOSIS — Z23 Encounter for immunization: Secondary | ICD-10-CM

## 2019-01-08 ENCOUNTER — Other Ambulatory Visit: Payer: Self-pay

## 2019-01-09 NOTE — Progress Notes (Signed)
Patient in today for third Gardasil injection.   Contraception: Abstinence- patient states not sexually since ~03/2018 LMP: 12-31-2018 Last OV: 07-26-2018 with Dr. Quincy Simmonds AEX scheduled for 07-22-19  Injection given in left deltoid. Patient tolerated shot well.   Patient informed she has completed gardasil series.  Routed to provider for final review.  Encounter closed.

## 2019-01-10 ENCOUNTER — Other Ambulatory Visit: Payer: Self-pay

## 2019-01-10 ENCOUNTER — Ambulatory Visit (INDEPENDENT_AMBULATORY_CARE_PROVIDER_SITE_OTHER): Payer: BC Managed Care – PPO | Admitting: *Deleted

## 2019-01-10 VITALS — BP 120/86 | HR 74 | Temp 97.8°F | Resp 14 | Ht 62.0 in | Wt 212.5 lb

## 2019-01-10 DIAGNOSIS — Z23 Encounter for immunization: Secondary | ICD-10-CM | POA: Diagnosis not present

## 2019-03-21 ENCOUNTER — Telehealth: Payer: Self-pay | Admitting: Obstetrics and Gynecology

## 2019-03-21 NOTE — Telephone Encounter (Signed)
I would repeat a pregnancy test and call if positive.  Keep appointment for 03/27/19.

## 2019-03-21 NOTE — Telephone Encounter (Signed)
Patient's cycle is 10 day late. She has taken several pregnancy tests which were negative.

## 2019-03-21 NOTE — Telephone Encounter (Signed)
Spoke with patient, advised per Dr. Silva. Patient verbalizes understanding and is agreeable.  Encounter closed.  

## 2019-03-21 NOTE — Telephone Encounter (Signed)
Spoke with patient. Patient reports menses is 10 days late. LMP 02/15/19. Denies pain, n/c, fever/chills. No contraceptive, is SA. Took Plan B on 01/28/19, was within 72 hours of encounter. Has taken several UPT, most recent on 8/28, all negative. Advised Plan B can cause some irregular menses. If menses does not start, then OV recommended.    Patient request OV. Patient declines OV this week, states she is in [place]. OV scheduled for 9/9 at 4:30pm with Dr. Quincy Simmonds. Advised patient will review with Dr. Quincy Simmonds and return call if any additional recommendations. Patient verbalizes understanding.   Dr. Quincy Simmonds -any additional recommendations?

## 2019-03-22 ENCOUNTER — Other Ambulatory Visit: Payer: Self-pay

## 2019-03-26 ENCOUNTER — Telehealth: Payer: Self-pay | Admitting: Obstetrics and Gynecology

## 2019-03-26 NOTE — Telephone Encounter (Signed)
Patient canceled her appointment tomorrow for late menses. She started her cycle and no longer needs this appointment

## 2019-03-27 ENCOUNTER — Ambulatory Visit: Payer: Self-pay | Admitting: Obstetrics and Gynecology

## 2019-07-22 ENCOUNTER — Ambulatory Visit: Payer: BC Managed Care – PPO | Admitting: Obstetrics and Gynecology

## 2019-08-21 ENCOUNTER — Ambulatory Visit: Payer: BC Managed Care – PPO | Admitting: Obstetrics and Gynecology

## 2019-09-03 ENCOUNTER — Other Ambulatory Visit: Payer: Self-pay

## 2019-09-04 ENCOUNTER — Ambulatory Visit: Payer: BC Managed Care – PPO | Admitting: Obstetrics and Gynecology

## 2019-09-04 NOTE — Progress Notes (Deleted)
39 y.o. G3P0030 Single African American female here for annual exam.    PCP:     No LMP recorded.           Sexually active: {yes no:314532}  The current method of family planning is abstinence.    Exercising: {yes no:314532}  {types:19826} Smoker:  {YES NO:22349}  Health Maintenance: Pap: 07-06-18 Neg:Neg HR HPV, 04-27-18 ASCUS History of abnormal Pap:  {YES NO:22349} MMG:  n/a Colonoscopy:  *** BMD:   ***  Result  *** TDaP:  *** Gardasil:   Yes, completed HIV: 07-09-18 NR Hep C:07-06-18 Neg Screening Labs:  Hb today: ***, Urine today: ***   reports that she has never smoked. She has never used smokeless tobacco. She reports that she does not drink alcohol or use drugs.  Past Medical History:  Diagnosis Date  . Anxiety   . Fibroid    by [place] 2020  . History of menstrual cramps     No past surgical history on file.  Current Outpatient Medications  Medication Sig Dispense Refill  . ibuprofen (ADVIL,MOTRIN) 800 MG tablet Take 1 tablet (800 mg total) by mouth every 8 (eight) hours as needed for pain. 21 tablet 0   No current facility-administered medications for this visit.    Family History  Problem Relation Age of Onset  . Diabetes Father   . Non-Hodgkin's lymphoma Father   . Fibroids Maternal Grandmother     Review of Systems  Exam:   There were no vitals taken for this visit.    General appearance: alert, cooperative and appears stated age Head: normocephalic, without obvious abnormality, atraumatic Neck: no adenopathy, supple, symmetrical, trachea midline and thyroid normal to inspection and palpation Lungs: clear to auscultation bilaterally Breasts: normal appearance, no masses or tenderness, No nipple retraction or dimpling, No nipple discharge or bleeding, No axillary adenopathy Heart: regular rate and rhythm Abdomen: soft, non-tender; no masses, no organomegaly Extremities: extremities normal, atraumatic, no cyanosis or edema Skin: skin color,  texture, turgor normal. No rashes or lesions Lymph nodes: cervical, supraclavicular, and axillary nodes normal. Neurologic: grossly normal  Pelvic: External genitalia:  no lesions              No abnormal inguinal nodes palpated.              Urethra:  normal appearing urethra with no masses, tenderness or lesions              Bartholins and Skenes: normal                 Vagina: normal appearing vagina with normal color and discharge, no lesions              Cervix: no lesions              Pap taken: {yes no:314532} Bimanual Exam:  Uterus:  normal size, contour, position, consistency, mobility, non-tender              Adnexa: no mass, fullness, tenderness              Rectal exam: {yes no:314532}.  Confirms.              Anus:  normal sphincter tone, no lesions  Chaperone was present for exam.  Assessment:   Well woman visit with normal exam.   Plan: Mammogram screening discussed. Self breast awareness reviewed. Pap and HR HPV as above. Guidelines for Calcium, Vitamin D, regular exercise program including cardiovascular and weight bearing  exercise.   Follow up annually and prn.   Additional counseling given.  {yes [id number]. _______ minutes face to face time of which over 50% was spent in counseling.    After visit summary provided.

## 2019-09-12 NOTE — Progress Notes (Deleted)
39 y.o. G3P0030 Single African American female here for annual exam.    PCP:     No LMP recorded.           Sexually active: {yes no:314532}  The current method of family planning is {contraception:315051}.    Exercising: {yes no:314532}  {types:19826} Smoker:  {YES NO:22349}  Health Maintenance: Pap:  07-06-18 Neg:Neg HR HPV, 04-27-18 ASCUS History of abnormal Pap:  {YES NO:22349} MMG:  *** Colonoscopy:  *** BMD:   ***  Result  *** TDaP:  *** Gardasil:   Yes, completed HIV: 07-09-18 NR Hep C: 07-06-18 Neg Screening Labs:  Hb today: ***, Urine today: ***   reports that she has never smoked. She has never used smokeless tobacco. She reports that she does not drink alcohol or use drugs.  Past Medical History:  Diagnosis Date  . Anxiety   . Fibroid    by [place] 2020  . History of menstrual cramps     No past surgical history on file.  Current Outpatient Medications  Medication Sig Dispense Refill  . ibuprofen (ADVIL,MOTRIN) 800 MG tablet Take 1 tablet (800 mg total) by mouth every 8 (eight) hours as needed for pain. 21 tablet 0   No current facility-administered medications for this visit.    Family History  Problem Relation Age of Onset  . Diabetes Father   . Non-Hodgkin's lymphoma Father   . Fibroids Maternal Grandmother     Review of Systems  Exam:   There were no vitals taken for this visit.    General appearance: alert, cooperative and appears stated age Head: normocephalic, without obvious abnormality, atraumatic Neck: no adenopathy, supple, symmetrical, trachea midline and thyroid normal to inspection and palpation Lungs: clear to auscultation bilaterally Breasts: normal appearance, no masses or tenderness, No nipple retraction or dimpling, No nipple discharge or bleeding, No axillary adenopathy Heart: regular rate and rhythm Abdomen: soft, non-tender; no masses, no organomegaly Extremities: extremities normal, atraumatic, no cyanosis or edema Skin: skin  color, texture, turgor normal. No rashes or lesions Lymph nodes: cervical, supraclavicular, and axillary nodes normal. Neurologic: grossly normal  Pelvic: External genitalia:  no lesions              No abnormal inguinal nodes palpated.              Urethra:  normal appearing urethra with no masses, tenderness or lesions              Bartholins and Skenes: normal                 Vagina: normal appearing vagina with normal color and discharge, no lesions              Cervix: no lesions              Pap taken: {yes no:314532} Bimanual Exam:  Uterus:  normal size, contour, position, consistency, mobility, non-tender              Adnexa: no mass, fullness, tenderness              Rectal exam: {yes no:314532}.  Confirms.              Anus:  normal sphincter tone, no lesions  Chaperone was present for exam.  Assessment:   Well woman visit with normal exam.   Plan: Mammogram screening discussed. Self breast awareness reviewed. Pap and HR HPV as above. Guidelines for Calcium, Vitamin D, regular exercise program including cardiovascular and  weight bearing exercise.   Follow up annually and prn.   Additional counseling given.  {yes [id number]. _______ minutes face to face time of which over 50% was spent in counseling.    After visit summary provided.

## 2019-09-16 ENCOUNTER — Ambulatory Visit: Payer: BC Managed Care – PPO | Admitting: Obstetrics and Gynecology

## 2019-10-22 ENCOUNTER — Telehealth: Payer: Self-pay | Admitting: *Deleted

## 2019-10-22 NOTE — Telephone Encounter (Signed)
Message left to return call to [facility] at [phone].   Patient in 08 recall for 06/2019. Patient needs to schedule aex.

## 2019-12-31 NOTE — Telephone Encounter (Signed)
Message left to return call to Ahnesty Finfrock at [phone].    

## 2020-01-09 NOTE — Telephone Encounter (Signed)
Patient has not returned call x2 in regards to recall. Routing to K. Sprague, Nursing Supervisor.

## 2020-01-13 NOTE — Telephone Encounter (Signed)
Ok to remove patient from recall and not send a letter. She indicated she would follow up with her PCP for her next pap and HPV testing when she came in for her ultrasound appointment last year.

## 2020-01-13 NOTE — Progress Notes (Signed)
Erroneous encounter

## 2020-01-13 NOTE — Telephone Encounter (Signed)
Removed patient from recall. Letter not sent to patient. Encounter closed.

## 2020-01-13 NOTE — Telephone Encounter (Signed)
Letter to Dr.Silva for review. °

## 2020-02-11 ENCOUNTER — Ambulatory Visit: Payer: BC Managed Care – PPO | Attending: Family

## 2020-02-11 DIAGNOSIS — Z23 Encounter for immunization: Secondary | ICD-10-CM

## 2020-02-11 NOTE — Progress Notes (Signed)
   Covid-19 Vaccination Clinic  Name:  SHARDAE KLEINMAN    MRN: [id number] DOB: December 19, 1980  02/11/2020  Ms. Raczkowski was observed post Covid-19 immunization for 15 minutes without incident. She was provided with Vaccine Information Sheet and instruction to access the V-Safe system.   Ms. Blumberg was instructed to call 911 with any severe reactions post vaccine: Marland Kitchen Difficulty breathing  . Swelling of face and throat  . A fast heartbeat  . A bad rash all over body  . Dizziness and weakness   Immunizations Administered    Name Date Dose VIS Date Route   Pfizer COVID-19 Vaccine 02/11/2020  4:24 PM 0.3 mL 09/11/2018 Intramuscular   Manufacturer: [place]   Lot: [id number]   [place]: [id number]

## 2020-03-03 ENCOUNTER — Ambulatory Visit: Payer: BC Managed Care – PPO | Attending: Internal Medicine

## 2020-03-03 DIAGNOSIS — Z23 Encounter for immunization: Secondary | ICD-10-CM

## 2020-03-03 NOTE — Progress Notes (Signed)
   Covid-19 Vaccination Clinic  Name:  [name]    MRN: [id number] DOB: 11-29-1980  03/03/2020  [name] was observed post Covid-19 immunization for 15 minutes without incident. She was provided with Vaccine Information Sheet and instruction to access the V-Safe system.   [name] was instructed to call 911 with any severe reactions post vaccine: Marland Kitchen Difficulty breathing  . Swelling of face and throat  . A fast heartbeat  . A bad rash all over body  . Dizziness and weakness   Immunizations Administered    Name Date Dose VIS Date Route   Pfizer COVID-19 Vaccine 03/03/2020  3:22 PM 0.3 mL 09/11/2018 Intramuscular   Manufacturer: [place]   Lot: [id number]   [place]: [id number]

## 2020-03-10 ENCOUNTER — Ambulatory Visit: Payer: BC Managed Care – PPO

## 2021-10-01 ENCOUNTER — Other Ambulatory Visit: Payer: Self-pay | Admitting: Family Medicine

## 2021-10-01 DIAGNOSIS — Z1231 Encounter for screening mammogram for malignant neoplasm of breast: Secondary | ICD-10-CM

## 2021-12-30 ENCOUNTER — Ambulatory Visit
Admission: RE | Admit: 2021-12-30 | Discharge: 2021-12-30 | Disposition: A | Discharge status: Home / Self Care | Payer: BC Managed Care – PPO | Source: Ambulatory Visit | Attending: Family Medicine | Admitting: Family Medicine

## 2021-12-30 DIAGNOSIS — Z1231 Encounter for screening mammogram for malignant neoplasm of breast: Secondary | ICD-10-CM

## 2022-01-03 ENCOUNTER — Other Ambulatory Visit: Payer: Self-pay | Admitting: Family Medicine

## 2022-01-03 DIAGNOSIS — R928 Other abnormal and inconclusive findings on diagnostic imaging of breast: Secondary | ICD-10-CM

## 2022-02-22 ENCOUNTER — Other Ambulatory Visit: Payer: BC Managed Care – PPO

## 2022-02-22 ENCOUNTER — Other Ambulatory Visit: Payer: Self-pay | Admitting: Family Medicine

## 2022-02-22 DIAGNOSIS — R928 Other abnormal and inconclusive findings on diagnostic imaging of breast: Secondary | ICD-10-CM

## 2022-03-15 ENCOUNTER — Ambulatory Visit
Admission: RE | Admit: 2022-03-15 | Discharge: 2022-03-15 | Disposition: A | Discharge status: Home / Self Care | Payer: BC Managed Care – PPO | Source: Ambulatory Visit | Attending: Family Medicine | Admitting: Family Medicine

## 2022-03-15 ENCOUNTER — Other Ambulatory Visit: Payer: Self-pay | Admitting: Family Medicine

## 2022-03-15 DIAGNOSIS — R928 Other abnormal and inconclusive findings on diagnostic imaging of breast: Secondary | ICD-10-CM

## 2022-03-15 DIAGNOSIS — N6310 Unspecified lump in the right breast, unspecified quadrant: Secondary | ICD-10-CM

## 2022-09-19 ENCOUNTER — Other Ambulatory Visit: Payer: Self-pay | Admitting: Family Medicine

## 2022-09-19 ENCOUNTER — Ambulatory Visit
Admission: RE | Admit: 2022-09-19 | Discharge: 2022-09-19 | Disposition: A | Discharge status: Home / Self Care | Payer: BC Managed Care – PPO | Source: Ambulatory Visit | Attending: Family Medicine | Admitting: Family Medicine

## 2022-09-19 DIAGNOSIS — N6310 Unspecified lump in the right breast, unspecified quadrant: Secondary | ICD-10-CM

## 2022-12-25 IMAGING — MG MM DIGITAL SCREENING BILAT W/ TOMO AND CAD
8 series · 8 of 24 positions shown · non-contrast
Comparison: None.

CLINICAL DATA: Screening.

EXAM:
DIGITAL SCREENING BILATERAL MAMMOGRAM WITH TOMOSYNTHESIS AND CAD
TECHNIQUE: Bilateral screening digital craniocaudal and mediolateral oblique
mammograms were obtained. Bilateral screening digital breast
tomosynthesis was performed. The images were evaluated with
computer-aided detection.

[R MLO synth-2D]
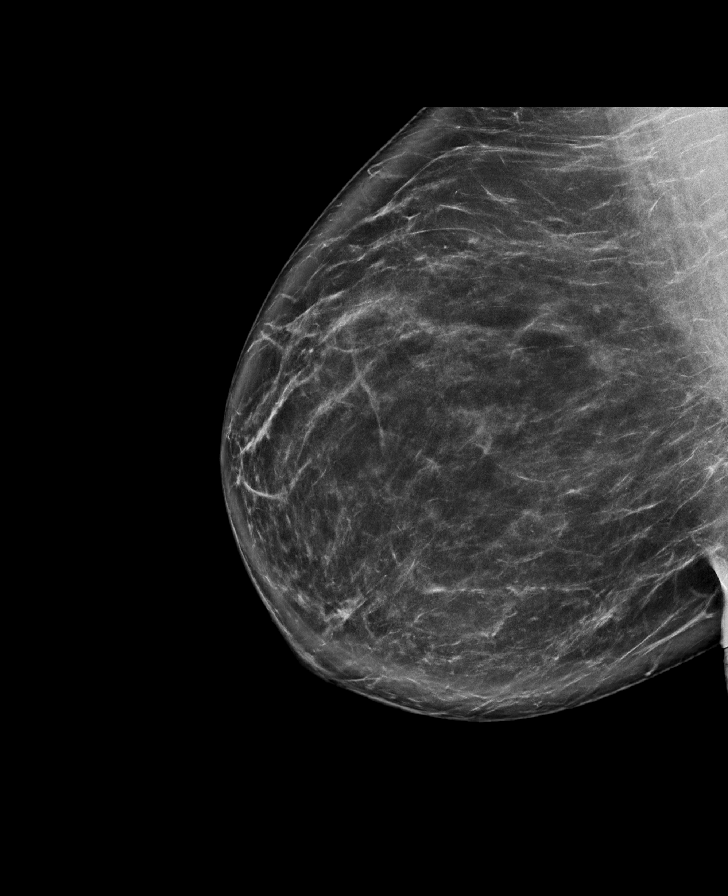

[L CC synth-2D]
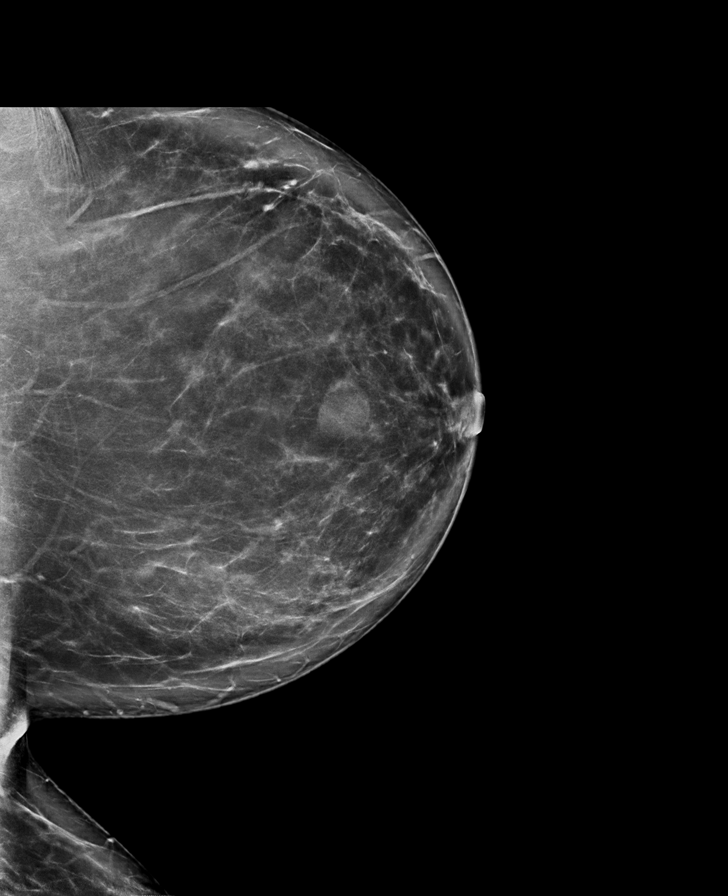

[L MLO synth-2D]
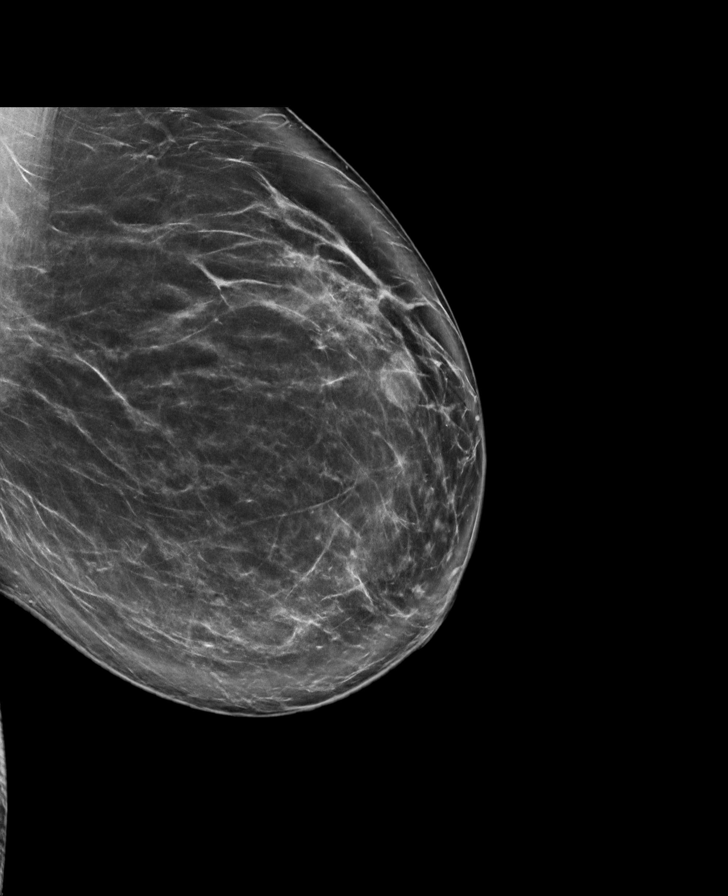

[R CC synth-2D]
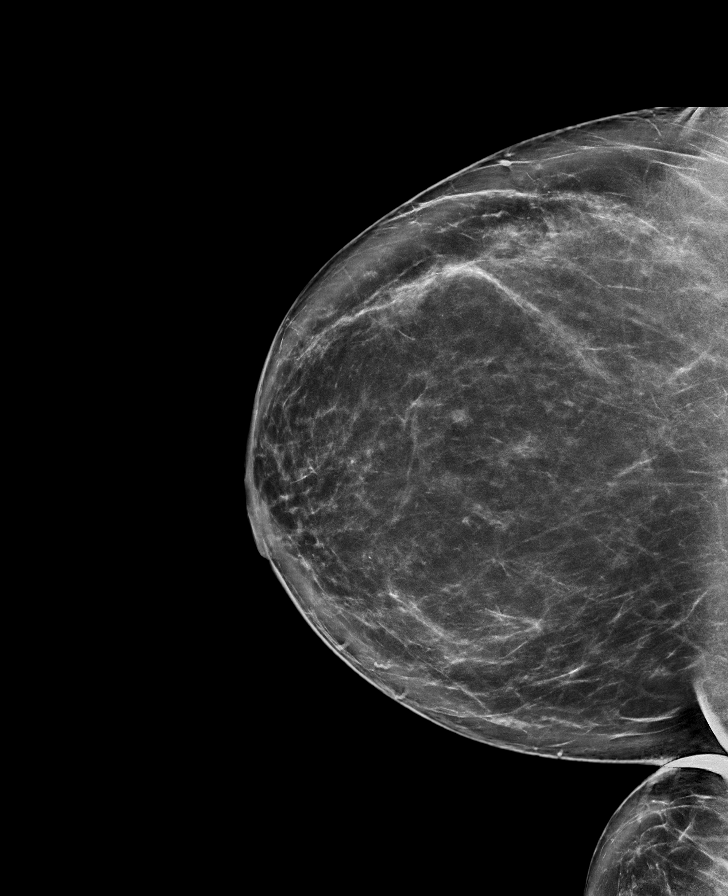

[R MLO tomo · tomo slice 53/106.0]
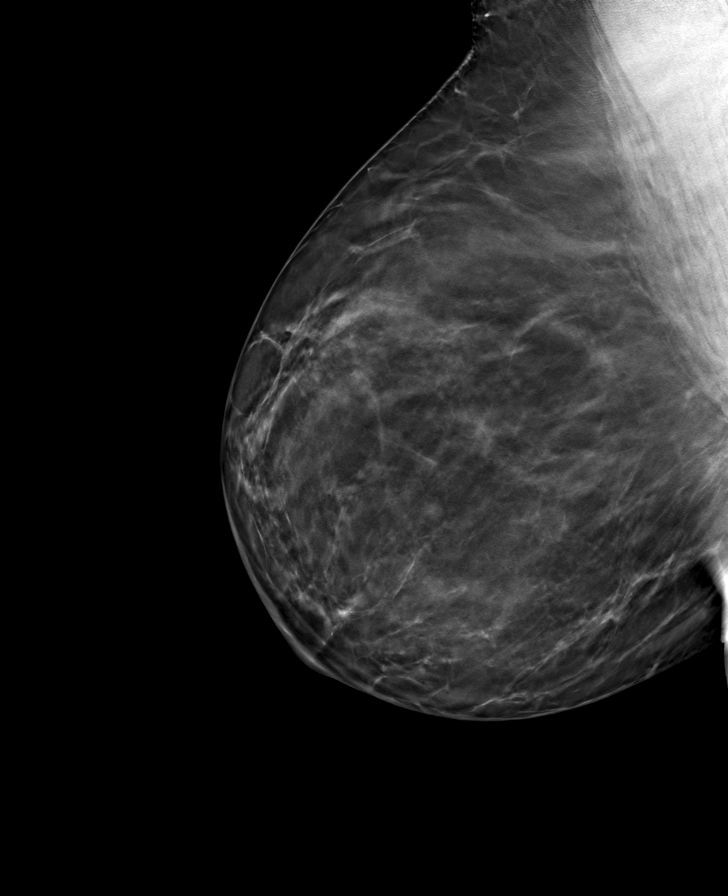

[L CC tomo · tomo slice 53/106.0]
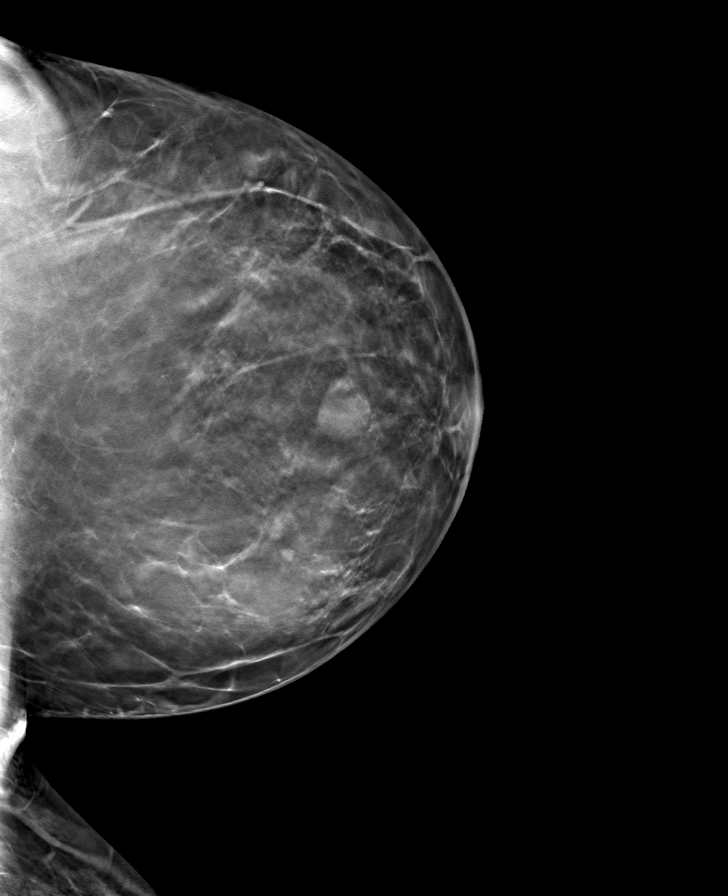

[L MLO tomo · tomo slice 56/111.0]
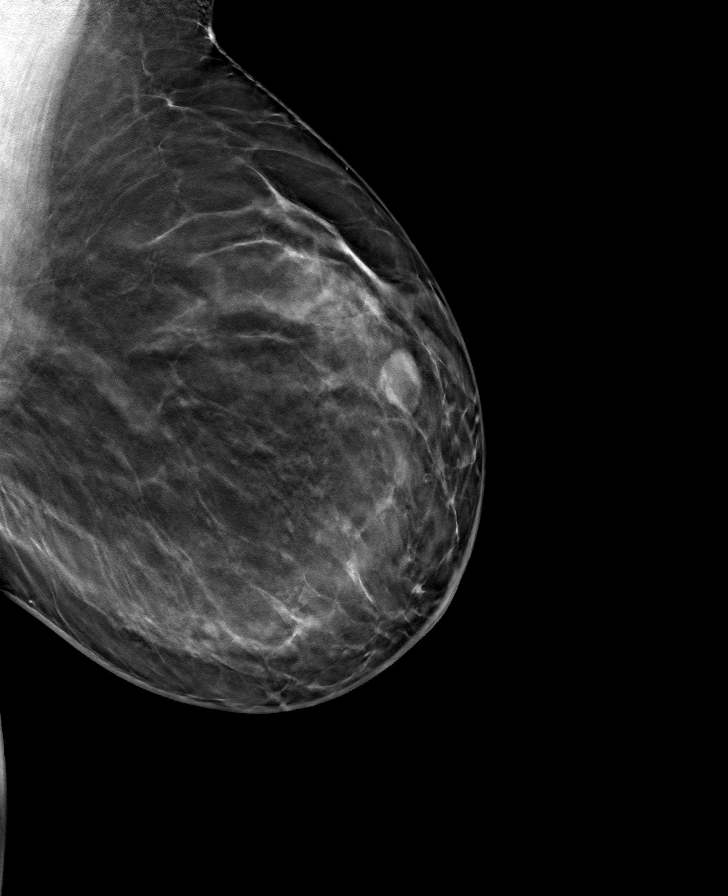

[R CC tomo · tomo slice 53/105.0]
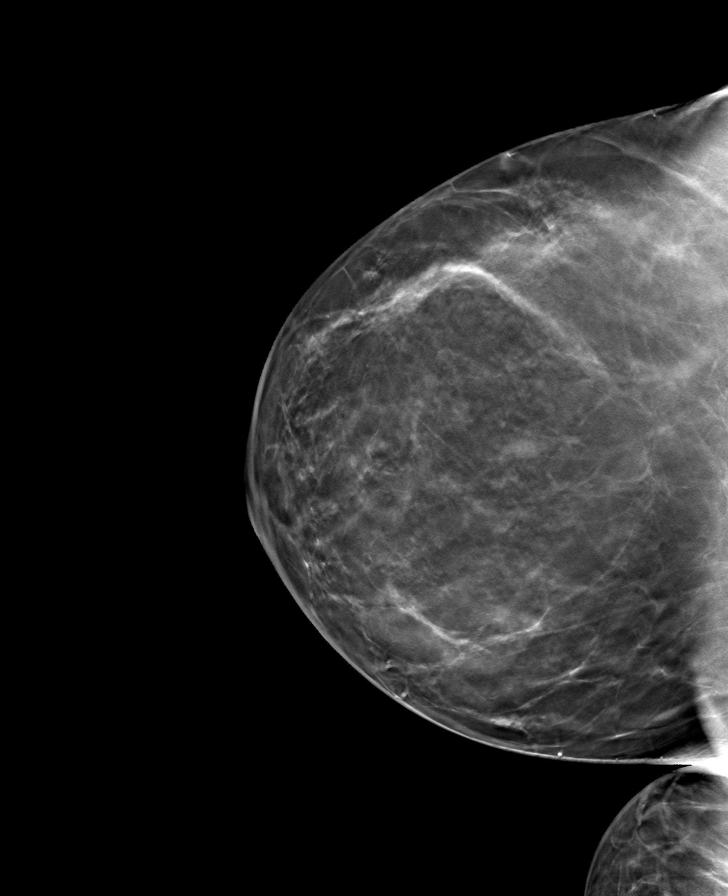

[8 of 24 positions shown; findings below may reference images not displayed]

ACR Breast Density Category b: There are scattered areas of
fibroglandular density.
FINDINGS: In the right breast possible mass requires further evaluation.

In the left breast possible mass requires further evaluation.
IMPRESSION: Further evaluation is suggested for possible mass in the right
breast.

Further evaluation is suggested for possible mass in the left
breast.

RECOMMENDATION:
Diagnostic mammogram and possibly ultrasound of both breasts.
(Code:[id number]-[id number])

The patient will be contacted regarding the findings, and additional
imaging will be scheduled.

BI-RADS CATEGORY  0: Incomplete. Need additional imaging evaluation
and/or prior mammograms for comparison.

## 2023-03-14 ENCOUNTER — Encounter: Payer: BC Managed Care – PPO | Admitting: Family Medicine

## 2023-03-22 ENCOUNTER — Other Ambulatory Visit: Payer: BC Managed Care – PPO

## 2023-05-22 ENCOUNTER — Other Ambulatory Visit: Payer: Self-pay | Admitting: Family Medicine

## 2023-05-22 ENCOUNTER — Ambulatory Visit
Admission: RE | Admit: 2023-05-22 | Discharge: 2023-05-22 | Disposition: A | Discharge status: Home / Self Care | Payer: BC Managed Care – PPO | Source: Ambulatory Visit | Attending: Family Medicine | Admitting: Family Medicine

## 2023-05-22 DIAGNOSIS — N6310 Unspecified lump in the right breast, unspecified quadrant: Secondary | ICD-10-CM

## 2023-05-29 ENCOUNTER — Encounter: Payer: Self-pay | Admitting: Family Medicine

## 2023-11-20 ENCOUNTER — Ambulatory Visit
Admission: RE | Admit: 2023-11-20 | Discharge: 2023-11-20 | Disposition: A | Discharge status: Home / Self Care | Payer: BC Managed Care – PPO | Source: Ambulatory Visit | Attending: Family Medicine | Admitting: Family Medicine

## 2023-11-20 DIAGNOSIS — N6310 Unspecified lump in the right breast, unspecified quadrant: Secondary | ICD-10-CM

## 2023-11-21 ENCOUNTER — Other Ambulatory Visit: Payer: Self-pay | Admitting: Family Medicine

## 2023-11-21 ENCOUNTER — Other Ambulatory Visit (HOSPITAL_COMMUNITY)
Admission: RE | Admit: 2023-11-21 | Discharge: 2023-11-21 | Disposition: A | Discharge status: Home / Self Care | Source: Ambulatory Visit | Attending: Radiology | Admitting: Radiology

## 2023-11-21 ENCOUNTER — Ambulatory Visit (INDEPENDENT_AMBULATORY_CARE_PROVIDER_SITE_OTHER): Payer: Self-pay | Admitting: Radiology

## 2023-11-21 ENCOUNTER — Encounter: Payer: Self-pay | Admitting: Radiology

## 2023-11-21 VITALS — BP 114/72 | HR 84 | Ht 62.25 in | Wt 203.2 lb

## 2023-11-21 DIAGNOSIS — N6310 Unspecified lump in the right breast, unspecified quadrant: Secondary | ICD-10-CM

## 2023-11-21 DIAGNOSIS — Z1331 Encounter for screening for depression: Secondary | ICD-10-CM

## 2023-11-21 DIAGNOSIS — E66812 Obesity, class 2: Secondary | ICD-10-CM

## 2023-11-21 DIAGNOSIS — D219 Benign neoplasm of connective and other soft tissue, unspecified: Secondary | ICD-10-CM

## 2023-11-21 DIAGNOSIS — Z01419 Encounter for gynecological examination (general) (routine) without abnormal findings: Secondary | ICD-10-CM | POA: Diagnosis present

## 2023-11-21 DIAGNOSIS — E6609 Other obesity due to excess calories: Secondary | ICD-10-CM | POA: Diagnosis not present

## 2023-11-21 DIAGNOSIS — Z6836 Body mass index (BMI) 36.0-36.9, adult: Secondary | ICD-10-CM

## 2023-11-21 MED ORDER — ZEPBOUND 2.5 MG/0.5ML ~~LOC~~ SOLN: 2.5000 mg | SUBCUTANEOUS | 0 refills | Status: DC

## 2023-11-21 MED ORDER — NORETHINDRONE 0.35 MG PO TABS: 1.0000 | ORAL_TABLET | Freq: Every day | ORAL | 4 refills | Status: AC

## 2023-11-21 NOTE — Progress Notes (Signed)
 [name] [birth year] [id number]   History:  43 y.o. G3P0 presents for annual exam. On micronor for menstrual control, hx of fibroids. Doing well. Stopped wegovy due to loss of coverage with insurance plan. Interested in restarting, ok to pay out of pocket. Would like to try zepbound to treat her insulin resistance. She was prescribed phentermine at [facility] 2 months ago but has only taken 3 pills, did not like the side effects.  Gynecologic History Patient's last menstrual period was 10/23/2023 (exact date). Period Cycle (Days): 28 Period Duration (Days): 3 Period Pattern: Regular Menstrual Flow: Moderate, Light, Heavy Menstrual Control: Thin pad, Maxi pad Dysmenorrhea: (!) Severe Dysmenorrhea Symptoms: Cramping Contraception/Family planning: oral progesterone-only contraceptive Sexually active: yes Last Pap: 2019. Results were: normal Hx of ASCUS Last mammogram: 05/22/23. Results were: normal  Obstetric History OB History  Gravida Para Term Preterm AB Living  3 0 0 0 3 0  SAB IAB Ectopic Multiple Live Births  0 0 0 0 0    # Outcome Date GA Lbr Len/2nd Weight Sex Type Anes PTL Lv  3 AB           2 AB           1 AB                11/21/2023   10:14 AM  Depression screen PHQ 2/9  Decreased Interest 0  Down, Depressed, Hopeless 0  PHQ - 2 Score 0     The following portions of the patient's history were reviewed and updated as appropriate: allergies, current medications, past family history, past medical history, past social history, past surgical history, and problem list.  Review of Systems  All other systems reviewed and are negative.   Past medical history, past surgical history, family history and social history were all reviewed and documented in the EPIC chart.  Exam:  Vitals:   11/21/23 1014  BP: 114/72  Pulse: 84  SpO2: 98%  Weight: 203 lb 3.2 oz (92.2 kg)  Height: 5' 2.25" (1.581 m)   Body mass index is 36.87 kg/m.  Physical  Exam Vitals and nursing note reviewed. Exam conducted with a chaperone present.  Constitutional:      Appearance: Normal appearance. She is obese.  HENT:     Head: Normocephalic and atraumatic.  Neck:     Thyroid: No thyroid mass, thyromegaly or thyroid tenderness.  Cardiovascular:     Rate and Rhythm: Regular rhythm.     Heart sounds: Normal heart sounds.  Pulmonary:     Effort: Pulmonary effort is normal.     Breath sounds: Normal breath sounds.  Chest:  Breasts:    Breasts are symmetrical.     Right: Normal. No inverted nipple, mass, nipple discharge, skin change or tenderness.     Left: Normal. No inverted nipple, mass, nipple discharge, skin change or tenderness.  Abdominal:     General: Abdomen is flat. Bowel sounds are normal.     Palpations: Abdomen is soft.  Genitourinary:    General: Normal vulva.     Vagina: Normal. No vaginal discharge, bleeding or lesions.     Cervix: Normal. No discharge or lesion.     Uterus: Normal. Not enlarged and not tender.      Adnexa: Right adnexa normal and left adnexa normal.       Right: No mass, tenderness or fullness.         Left: No mass, tenderness or fullness.  Lymphadenopathy:     Upper Body:     Right upper body: No axillary adenopathy.     Left upper body: No axillary adenopathy.  Skin:    General: Skin is warm and dry.  Neurological:     Mental Status: She is alert and oriented to person, place, and time.  Psychiatric:        Mood and Affect: Mood normal.        Thought Content: Thought content normal.        Judgment: Judgment normal.      Ellis Guys, CMA present for exam  Assessment/Plan:   1. Well woman exam with routine gynecological exam (Primary) - Cytology - PAP( [facility])  2. Depression screening negative  3. Class 2 obesity due to excess calories without serious comorbidity with body mass index (BMI) of 36.0 to 36.9 in adult - tirzepatide (ZEPBOUND) 2.5 MG/0.5ML injection vial; Inject 2.5 mg into  the skin once a week.  Dispense: 2 mL; Refill: 0  Increase fiber, protein and fluids. Continue regular exercise.  Starting weight 220 Current weight 203 Goal weight 180  4. Fibroids - norethindrone (MICRONOR) 0.35 MG tablet; Take 1 tablet (0.35 mg total) by mouth daily.  Dispense: 84 tablet; Refill: 4    Return in about 4 weeks (around 12/19/2023) for Med Follow-up.  Synetta Eves B WHNP-BC 11:11 AM 11/21/2023

## 2023-11-22 LAB — CYTOLOGY - PAP
Comment: NEGATIVE
Diagnosis: NEGATIVE
High risk HPV: NEGATIVE

## 2023-11-23 ENCOUNTER — Other Ambulatory Visit: Payer: Self-pay

## 2023-11-23 DIAGNOSIS — N760 Acute vaginitis: Secondary | ICD-10-CM

## 2023-11-23 DIAGNOSIS — B3731 Acute candidiasis of vulva and vagina: Secondary | ICD-10-CM

## 2023-11-23 MED ORDER — FLUCONAZOLE 150 MG PO TABS: ORAL_TABLET | ORAL | 0 refills | Status: AC

## 2023-11-23 MED ORDER — METRONIDAZOLE 500 MG PO TABS: 500.0000 mg | ORAL_TABLET | Freq: Two times a day (BID) | ORAL | 0 refills | Status: AC

## 2023-12-07 ENCOUNTER — Other Ambulatory Visit: Payer: Self-pay | Admitting: Radiology

## 2023-12-07 DIAGNOSIS — E66812 Obesity, class 2: Secondary | ICD-10-CM

## 2023-12-07 NOTE — Telephone Encounter (Signed)
 Med refill request: Zepbound 2.5 mg Last AEX: 11/21/23 Next OV: 12/21/23 follow up Next AEX: none scheduled Last MMG (if hormonal med) n/a Refill authorized: Please approve or deny as appropriate.

## 2023-12-21 ENCOUNTER — Ambulatory Visit: Admitting: Radiology

## 2023-12-26 ENCOUNTER — Ambulatory Visit: Admitting: Radiology

## 2023-12-26 ENCOUNTER — Encounter: Payer: Self-pay | Admitting: Radiology

## 2023-12-26 DIAGNOSIS — E66812 Obesity, class 2: Secondary | ICD-10-CM

## 2023-12-26 DIAGNOSIS — Z6836 Body mass index (BMI) 36.0-36.9, adult: Secondary | ICD-10-CM

## 2023-12-26 DIAGNOSIS — E6609 Other obesity due to excess calories: Secondary | ICD-10-CM

## 2023-12-26 MED ORDER — ZEPBOUND 2.5 MG/0.5ML ~~LOC~~ SOLN: 2.5000 mg | SUBCUTANEOUS | 1 refills | Status: DC

## 2023-12-26 NOTE — Progress Notes (Signed)
   [name] 03/09/1981 [id number]   History:  43 y.o. G3P0 presents for weight management. Doing well on zepbound  2.5mg . No side effects. Has lost 4lbs. Would like to stay on current dose due to self pay cost. Is exercising and eating well.  Gynecologic History Patient's last menstrual period was 12/19/2023 (exact date). Contraception/Family planning: oral progesterone-only contraceptive Sexually active: yes   Obstetric History OB History  Gravida Para Term Preterm AB Living  3 0 0 0 3 0  SAB IAB Ectopic Multiple Live Births  0 0 0 0 0    # Outcome Date GA Lbr Len/2nd Weight Sex Type Anes PTL Lv  3 AB           2 AB           1 AB                11/21/2023   10:14 AM  Depression screen PHQ 2/9  Decreased Interest 0  Down, Depressed, Hopeless 0  PHQ - 2 Score 0     The following portions of the patient's history were reviewed and updated as appropriate: allergies, current medications, past family history, past medical history, past social history, past surgical history, and problem list.  Review of Systems  All other systems reviewed and are negative.   Past medical history, past surgical history, family history and social history were all reviewed and documented in the EPIC chart.  Exam:  Vitals:   12/26/23 0903  BP: 114/76  Pulse: 77  SpO2: 97%  Weight: 199 lb 9.6 oz (90.5 kg)   Body mass index is 36.21 kg/m.  Physical Exam Constitutional:      Appearance: Normal appearance. She is obese.  Pulmonary:     Effort: Pulmonary effort is normal.  Neurological:     Mental Status: She is alert.  Psychiatric:        Mood and Affect: Mood normal.        Thought Content: Thought content normal.        Judgment: Judgment normal.      Ellis Guys, CMA present for exam  Starting weight:203 Current weight:199 First goal:180   Assessment/Plan:   1. Class 2 obesity due to excess calories without serious comorbidity with body mass index (BMI) of 36.0 to  36.9 in adult Would like to stay on current dose due to cost. Refills sent Follow up 8 weeks for weight check - tirzepatide  (ZEPBOUND ) 2.5 MG/0.5ML injection vial; Inject 2.5 mg into the skin once a week.  Dispense: 2 mL; Refill: 1    Risks and benefits discussed. Continue to focus on protein and fiber. Small frequent meals. Adequate hydration and electrolyte replacement. Will continue to exercise for at least a week including weight bearing exercise.   Return in about 8 weeks (around 02/20/2024) for Med Follow-up.  Laine Piggs WHNP-BC 9:23 AM 12/26/2023

## 2024-01-17 ENCOUNTER — Other Ambulatory Visit: Payer: Self-pay | Admitting: Obstetrics and Gynecology

## 2024-01-17 DIAGNOSIS — E66812 Obesity, class 2: Secondary | ICD-10-CM

## 2024-01-17 NOTE — Telephone Encounter (Signed)
 Med refill request: Zepbound  2.5 mg Last OV: 12/26/23 weight management Last AEX: 11/21/23 Next AEX: none scheduled Next OV: 02/20/24  Last MMG (if hormonal med) n/a RX was sent 12/26/23 for 2 ml with 1 refill. Refill authorized: Please Advise?

## 2024-01-25 ENCOUNTER — Other Ambulatory Visit: Payer: Self-pay | Admitting: Obstetrics and Gynecology

## 2024-01-25 DIAGNOSIS — E66812 Obesity, class 2: Secondary | ICD-10-CM

## 2024-01-25 NOTE — Telephone Encounter (Signed)
 Received refill request from pharmacy for Zepbound  2.5MG /0.5ML.  [name] called and spoke with pt she states that she doesn't need a refill at this time.   Sent a message back to the pharmacy to hold the prescription until pt needs it.

## 2024-02-20 ENCOUNTER — Ambulatory Visit: Admitting: Radiology

## 2024-03-21 ENCOUNTER — Encounter: Payer: Self-pay | Admitting: Radiology

## 2024-03-21 ENCOUNTER — Ambulatory Visit: Admitting: Radiology

## 2024-03-21 VITALS — BP 108/70 | Wt 199.0 lb

## 2024-03-21 DIAGNOSIS — E6609 Other obesity due to excess calories: Secondary | ICD-10-CM | POA: Diagnosis not present

## 2024-03-21 DIAGNOSIS — Z6836 Body mass index (BMI) 36.0-36.9, adult: Secondary | ICD-10-CM | POA: Diagnosis not present

## 2024-03-21 DIAGNOSIS — E66812 Obesity, class 2: Secondary | ICD-10-CM | POA: Diagnosis not present

## 2024-03-21 MED ORDER — ZEPBOUND 7.5 MG/0.5ML ~~LOC~~ SOLN: 7.5000 mg | SUBCUTANEOUS | 0 refills | Status: DC

## 2024-03-21 MED ORDER — ZEPBOUND 5 MG/0.5ML ~~LOC~~ SOLN: 5.0000 mg | SUBCUTANEOUS | 0 refills | Status: DC

## 2024-03-21 NOTE — Progress Notes (Signed)
   [name] 15-Sep-1980 [id number]   History:  43 y.o. G3 presents for weight management. Did well on zepbound  2.5mg , ran out and then moved. Would like to increase to 5mg .  Gynecologic History Patient's last menstrual period was 03/07/2024 (exact date). Contraception/Family planning: oral progesterone-only contraceptive   Obstetric History OB History  Gravida Para Term Preterm AB Living  3 0 0 0 3 0  SAB IAB Ectopic Multiple Live Births  0 0 0 0 0    # Outcome Date GA Lbr Len/2nd Weight Sex Type Anes PTL Lv  3 AB           2 AB           1 AB              ROS  Past medical history, past surgical history, family history and social history were all reviewed and documented in the EPIC chart.  Exam:  Vitals:   03/21/24 1008  BP: 108/70  Weight: 199 lb (90.3 kg)   Body mass index is 36.11 kg/m.  Physical Exam Constitutional:      Appearance: Normal appearance. She is obese.  Pulmonary:     Effort: Pulmonary effort is normal.  Neurological:     Mental Status: She is alert.  Psychiatric:        Mood and Affect: Mood normal.        Thought Content: Thought content normal.        Judgment: Judgment normal.      Starting weight:203   Current weight:199 First goal:180 Overall weight loss goal:165  Assessment/Plan:   1. Class 2 obesity due to excess calories without serious comorbidity with body mass index (BMI) of 36.0 to 36.9 in adult (Primary) - tirzepatide  (ZEPBOUND ) 5 MG/0.5ML injection vial; Inject 5 mg into the skin once a week.  Dispense: 2 mL; Refill: 0 - tirzepatide  (ZEPBOUND ) 7.5 MG/0.5ML injection vial; Inject 7.5 mg into the skin once a week.  Dispense: 2 mL; Refill: 0    Risks and benefits discussed. Continue to focus on protein and fiber. Small frequent meals. Adequate hydration and electrolyte replacement. Will continue to exercise for at least a week including weight bearing exercise.   Return in about 8 weeks (around 05/16/2024)  for Med Follow-up.  GINETTE COZIER B WHNP-BC 10:20 AM 03/21/2024

## 2024-05-17 ENCOUNTER — Encounter: Payer: Self-pay | Admitting: Radiology

## 2024-05-17 ENCOUNTER — Ambulatory Visit: Admitting: Radiology

## 2024-05-17 VITALS — BP 108/64 | HR 70 | Ht 63.0 in | Wt 193.0 lb

## 2024-05-17 DIAGNOSIS — E66812 Obesity, class 2: Secondary | ICD-10-CM | POA: Diagnosis not present

## 2024-05-17 DIAGNOSIS — Z6836 Body mass index (BMI) 36.0-36.9, adult: Secondary | ICD-10-CM | POA: Diagnosis not present

## 2024-05-17 DIAGNOSIS — E6609 Other obesity due to excess calories: Secondary | ICD-10-CM

## 2024-05-17 MED ORDER — ZEPBOUND 10 MG/0.5ML ~~LOC~~ SOLN: 10.0000 mg | SUBCUTANEOUS | 2 refills | Status: DC

## 2024-05-17 NOTE — Progress Notes (Signed)
   [name] [birth year] [id number]   History:  43 y.o. G3P0 presents for weight management. Doing well on zepbound , has lost 10lbs since starting. Ready to increase to 10mg . She had to stop exercising for a short time because of a dog bite.   Gynecologic History Patient's last menstrual period was 05/16/2024.   Obstetric History OB History  Gravida Para Term Preterm AB Living  3 0 0 0 3 0  SAB IAB Ectopic Multiple Live Births  0 0 0 0 0    # Outcome Date GA Lbr Len/2nd Weight Sex Type Anes PTL Lv  3 AB           2 AB           1 AB              Review of Systems  All other systems reviewed and are negative.   Past medical history, past surgical history, family history and social history were all reviewed and documented in the EPIC chart.  Exam:  Vitals:   05/17/24 0953  BP: 108/64  Pulse: 70  SpO2: 100%  Weight: 193 lb (87.5 kg)  Height: 5' 3 (1.6 m)   Body mass index is 34.19 kg/m.  Physical Exam Constitutional:      Appearance: Normal appearance. She is obese.  Pulmonary:     Effort: Pulmonary effort is normal.  Neurological:     Mental Status: She is alert.  Psychiatric:        Mood and Affect: Mood normal.        Thought Content: Thought content normal.        Judgment: Judgment normal.     Starting weight:203 Current weight:193 First goal:180 Overall weight loss goal:165  Assessment/Plan:   1. Class 2 obesity due to excess calories without serious comorbidity with body mass index (BMI) of 36.0 to 36.9 in adult (Primary) - tirzepatide  (ZEPBOUND ) 10 MG/0.5ML injection vial; Inject 10 mg into the skin once a week.  Dispense: 2 mL; Refill: 2    Risks and benefits discussed. Continue to focus on protein and fiber. Small frequent meals. Adequate hydration and electrolyte replacement. Will continue to exercise for at least a week including weight bearing exercise.   Return in about 8 weeks (around 07/12/2024) for Med  Follow-up.  GINETTE COZIER B WHNP-BC 10:02 AM 05/17/2024

## 2024-05-24 ENCOUNTER — Ambulatory Visit
Admission: RE | Admit: 2024-05-24 | Discharge: 2024-05-24 | Disposition: A | Discharge status: Home / Self Care | Source: Ambulatory Visit | Attending: Family Medicine | Admitting: Family Medicine

## 2024-05-24 DIAGNOSIS — N6310 Unspecified lump in the right breast, unspecified quadrant: Secondary | ICD-10-CM

## 2024-07-05 ENCOUNTER — Ambulatory Visit: Admitting: Radiology

## 2024-07-23 ENCOUNTER — Ambulatory Visit: Admitting: Radiology

## 2024-07-23 ENCOUNTER — Encounter: Payer: Self-pay | Admitting: Radiology

## 2024-07-23 VITALS — BP 124/76 | Wt 193.0 lb

## 2024-07-23 DIAGNOSIS — Z6836 Body mass index (BMI) 36.0-36.9, adult: Secondary | ICD-10-CM

## 2024-07-23 DIAGNOSIS — E66812 Obesity, class 2: Secondary | ICD-10-CM | POA: Diagnosis not present

## 2024-07-23 DIAGNOSIS — E6609 Other obesity due to excess calories: Secondary | ICD-10-CM

## 2024-07-23 MED ORDER — ZEPBOUND 12.5 MG/0.5ML ~~LOC~~ SOLN: 12.5000 mg | SUBCUTANEOUS | 0 refills | Status: AC

## 2024-07-23 MED ORDER — ZEPBOUND 15 MG/0.5ML ~~LOC~~ SOLN: 15.0000 mg | SUBCUTANEOUS | 1 refills | Status: AC

## 2024-07-23 NOTE — Progress Notes (Signed)
" ° °  [name] [birth year] [id number]   History:  44 y.o. G3P0 presents for weight management. Doing well on zepbound , has lost 10lbs since starting. Ready to increase to 12.5mg .   Gynecologic History Patient's last menstrual period was 07/23/2024 (exact date).   Obstetric History OB History  Gravida Para Term Preterm AB Living  3 0 0 0 3 0  SAB IAB Ectopic Multiple Live Births  0 0 0 0 0    # Outcome Date GA Lbr Len/2nd Weight Sex Type Anes PTL Lv  3 AB           2 AB           1 AB              Review of Systems  All other systems reviewed and are negative.   Past medical history, past surgical history, family history and social history were all reviewed and documented in the EPIC chart.  Exam:  Vitals:   07/23/24 1135  BP: 124/76  Weight: 193 lb (87.5 kg)   Body mass index is 34.19 kg/m.  Physical Exam Constitutional:      Appearance: Normal appearance. She is obese.  Pulmonary:     Effort: Pulmonary effort is normal.  Neurological:     Mental Status: She is alert.  Psychiatric:        Mood and Affect: Mood normal.        Thought Content: Thought content normal.        Judgment: Judgment normal.     Starting weight:203 Current weight:193 First goal:180 Overall weight loss goal:165  Assessment/Plan:   1. Class 2 obesity due to excess calories without serious comorbidity with body mass index (BMI) of 36.0 to 36.9 in adult (Primary) - Tirzepatide -Weight Management (ZEPBOUND ) 12.5 MG/0.5ML SOLN; Inject 12.5 mg into the skin once a week.  Dispense: 2 mL; Refill: 0 - Tirzepatide -Weight Management (ZEPBOUND ) 15 MG/0.5ML SOLN; Inject 15 mg into the skin once a week.  Dispense: 2 mL; Refill: 1    Risks and benefits discussed. Continue to focus on protein and fiber. Small frequent meals. Adequate hydration and electrolyte replacement. Will continue to exercise for at least a week including weight bearing exercise.   Return in about 8 weeks (around  09/17/2024) for Med Follow-up.  Sumayya Muha B WHNP-BC 12:00 PM 07/23/2024  "

## 2024-09-18 ENCOUNTER — Ambulatory Visit: Admitting: Radiology
